# Patient Record
Sex: Female | Born: 1970 | Race: Black or African American | Hispanic: No | Marital: Married | Smoking: Never smoker
Health system: Southern US, Community
[De-identification: ages and names within clinical notes are randomized; demographics above are authoritative.]

## PROBLEM LIST (undated history)

## (undated) DIAGNOSIS — R7303 Prediabetes: Secondary | ICD-10-CM

## (undated) DIAGNOSIS — F909 Attention-deficit hyperactivity disorder, unspecified type: Secondary | ICD-10-CM

## (undated) DIAGNOSIS — L80 Vitiligo: Secondary | ICD-10-CM

## (undated) DIAGNOSIS — A4902 Methicillin resistant Staphylococcus aureus infection, unspecified site: Secondary | ICD-10-CM

## (undated) DIAGNOSIS — O24419 Gestational diabetes mellitus in pregnancy, unspecified control: Secondary | ICD-10-CM

## (undated) DIAGNOSIS — O1490 Unspecified pre-eclampsia, unspecified trimester: Secondary | ICD-10-CM

## (undated) DIAGNOSIS — K4090 Unilateral inguinal hernia, without obstruction or gangrene, not specified as recurrent: Secondary | ICD-10-CM

## (undated) DIAGNOSIS — J40 Bronchitis, not specified as acute or chronic: Secondary | ICD-10-CM

## (undated) DIAGNOSIS — I499 Cardiac arrhythmia, unspecified: Secondary | ICD-10-CM

## (undated) DIAGNOSIS — E7800 Pure hypercholesterolemia, unspecified: Secondary | ICD-10-CM

## (undated) DIAGNOSIS — K5900 Constipation, unspecified: Secondary | ICD-10-CM

## (undated) HISTORY — PX: MOUTH SURGERY: SHX715

## (undated) HISTORY — DX: Pure hypercholesterolemia, unspecified: E78.00

## (undated) HISTORY — PX: WISDOM TOOTH EXTRACTION: SHX21

---

## 1998-12-26 ENCOUNTER — Other Ambulatory Visit: Admission: RE | Admit: 1998-12-26 | Discharge: 1998-12-26 | Payer: Self-pay | Admitting: Obstetrics and Gynecology

## 1999-10-29 ENCOUNTER — Encounter: Admission: RE | Admit: 1999-10-29 | Discharge: 2000-01-27 | Payer: Self-pay | Admitting: *Deleted

## 1999-12-23 ENCOUNTER — Inpatient Hospital Stay (HOSPITAL_COMMUNITY): Admission: AD | Admit: 1999-12-23 | Discharge: 1999-12-26 | Payer: Self-pay | Admitting: Obstetrics and Gynecology

## 1999-12-23 ENCOUNTER — Encounter (INDEPENDENT_AMBULATORY_CARE_PROVIDER_SITE_OTHER): Payer: Self-pay

## 2000-02-18 ENCOUNTER — Other Ambulatory Visit: Admission: RE | Admit: 2000-02-18 | Discharge: 2000-02-18 | Payer: Self-pay | Admitting: Obstetrics and Gynecology

## 2001-04-16 ENCOUNTER — Other Ambulatory Visit: Admission: RE | Admit: 2001-04-16 | Discharge: 2001-04-16 | Payer: Self-pay | Admitting: Obstetrics and Gynecology

## 2002-04-28 ENCOUNTER — Other Ambulatory Visit: Admission: RE | Admit: 2002-04-28 | Discharge: 2002-04-28 | Payer: Self-pay | Admitting: Obstetrics and Gynecology

## 2002-12-02 ENCOUNTER — Encounter: Admission: RE | Admit: 2002-12-02 | Discharge: 2002-12-02 | Payer: Self-pay | Admitting: Obstetrics and Gynecology

## 2002-12-02 ENCOUNTER — Encounter: Payer: Self-pay | Admitting: Obstetrics and Gynecology

## 2004-03-18 ENCOUNTER — Other Ambulatory Visit: Admission: RE | Admit: 2004-03-18 | Discharge: 2004-03-18 | Payer: Self-pay | Admitting: Obstetrics and Gynecology

## 2005-11-18 ENCOUNTER — Emergency Department (HOSPITAL_COMMUNITY): Admission: EM | Admit: 2005-11-18 | Discharge: 2005-11-18 | Payer: Self-pay | Admitting: Family Medicine

## 2007-11-14 ENCOUNTER — Emergency Department (HOSPITAL_COMMUNITY): Admission: EM | Admit: 2007-11-14 | Discharge: 2007-11-14 | Payer: Self-pay | Admitting: Emergency Medicine

## 2008-07-23 ENCOUNTER — Emergency Department (HOSPITAL_COMMUNITY): Admission: EM | Admit: 2008-07-23 | Discharge: 2008-07-23 | Payer: Self-pay | Admitting: Emergency Medicine

## 2009-04-01 ENCOUNTER — Emergency Department (HOSPITAL_COMMUNITY): Admission: EM | Admit: 2009-04-01 | Discharge: 2009-04-01 | Payer: Self-pay | Admitting: Emergency Medicine

## 2011-01-20 LAB — CULTURE, ROUTINE-ABSCESS: Gram Stain: NONE SEEN

## 2011-02-28 NOTE — Op Note (Signed)
[facility]  Patient:    Natasha Liu, Natasha Liu                           MRN: [id number] Proc. Date: 12/23/99 Adm. Date:  [id number] Attending:  Maxie Better                           Operative Report  PREOPERATIVE DIAGNOSES:       Non-reassuring fetal testing, previous cesarean section, class A-1 gestational diabetes, desires permanent sterilization.  POSTOPERATIVE DIAGNOSES:      Non-reassuring fetal testing, previous cesarean section, desires permanent sterilization, class A-1 gestational diabetes.  PROCEDURE:                    Repeat cesarean section, Sharl Ma hysterotomy; modified Pomeroy tubal ligation.  ANESTHESIA:                   Spinal.  SURGEON:                      Sheronette A. Cherly Hensen, M.D.  ASSISTANT:                    Lenoard Aden, M.D.  INDICATION:                   This is a 40 year old gravida 4, para 0-1-2-1 female at 38+ weeks gestation, who had a prior cesarean section and class A-1 gestational diabetes, who was found to have a biophysical profile of 4/10 at the office on December 23, 1999, who is now sent for a repeat cesarean section.  The patient had  been scheduled for a repeat C-section on December 26, 1999; she also desired permanent sterilization.  Risks and benefits of the procedure had been explained; the permanence as well as the nonversibility had also been discussed with respect to the tubal ligation.  The patient still desires to proceed.  The patient had had a nonstress test done in the office secondary to her class A-1 diabetes, which was noted to be nonreactive, and a biophysical profile was therefore ordered which showed a 4/10, -2 for movements and tone.  Consent was signed.  The patient was  transferred to the operating room.  DESCRIPTION OF PROCEDURE:     Under adequate spinal anesthesia, the patient was  placed in the supine position with a left lateral tilt.  The patient was sterilely prepped and  draped in usual fashion.  An indwelling Foley catheter was placed. A Pfannenstiel skin incision was made through a portion of the previous scar and carried down to the rectus fascia using Bovie cautery.  The rectus fascia was incised in the midline and extended bilaterally using Mayo scissors.  The rectus fascia was then bluntly, and with sharp dissection, dissected off the rectus muscle in superior and inferior fashion.  At that point, the parietal peritoneum was found to have been entered superiorly; clear fluid was noted from the abdominal cavity. Using the opening in the parietal peritoneum, the remaining portion of the parietal peritoneum was then opened inferiorly.  A manual inspection of the uterus showed no adhesions.  The vesicouterine peritoneum was then opened.  The bladder was then  bluntly dissected off the lower uterine segment.  The lower uterine segment was  noted to be very thin.  A curvilinear low transverse uterine incision was then ade and  extended bilaterally using bandage scissors.  A copious amount of clear amniotic fluid was noted.  A fetus in the left occipitotransverse position was noted.  Initial attempt at delivery of the vertex was not successful.  A vacuum was then used to apply, with resultant delivery of a live female infant who was bulb-suctioned on the abdomen.  Cord was then clamped and cut.  The baby was transferred to the team.  Apgars of 6/9 were subsequently assigned at one and five minutes.  Cord pH was obtained which was subsequently noted to be 7.12. Placenta was spontaneous, intact.  Uterus was exteriorized.  Uterine cavity was cleaned f debris.  Uterus was noted to have a 3-cm right intramural fibroid and several less than 1-cm subserosal fibroids posteriorly.  Tubes and ovaries were noted to be normal.  Appendix was noted to be normal.  The uterine incision was closed with one layer of 0 Monocryl in a running-locked stitch.  Attention  was then turned to the fallopian tubes.  The right fallopian tube was identified down to its fimbriated end.  The midportion of the tube was grasped with a Babcock.  The mesosalpinx underneath the [place] was then opened with cautery.  The portion of the tube was doubly tied with 0 chromic proximal and distal to the Babcock and the intervening segment of fallopian tube was then removed.  The tip of the cut tube was then cauterized.  The same procedure was performed on the contralateral side after again identifying the fimbriated end of the left fallopian tube.  Specimens labeled segments of right and left fallopian tubes were sent to pathology.  The uterus as then returned to the abdomen.  The abdomen was irrigated and suctioned of debris. Reinspection of the uterine incision showed good hemostasis.  Small bleeding along the peritoneal edge of the bladder was cauterized.  The parietal peritoneum and the vesicouterine peritoneum were now closed.  Inspection of the undersurface of the rectus fascia showed no bleeders.  The rectus fascia was closed with 0 Vicryl x 2. The subcutaneous area was irrigated; small bleeders cauterized.  The subcuticular area was injected with 0.25% Marcaine.  The skin was approximated using Ethicon  staples.  Specimens were placenta, not sent to pathology, and segments of the right and left fallopian tubes.  Estimated blood loss was 800 cc.  Intraoperative fluid was 2300 cc of crystalloid.  Urine output was 250 cc clear-yellow urine. Sponge and instrument counts x 2 were correct.  Complication was none.  The patient tolerated the procedure well and was transferred to the recovery room in stable  condition. DD:  12/23/99 TD:  12/24/99 Job: [id number]

## 2011-02-28 NOTE — Discharge Summary (Signed)
[facility]  Patient:    Natasha Liu, Natasha Liu                           MRN: [id number] Adm. Date:  [id number] Disc. Date: [id number] Attending:  Maxie Better                           Discharge Summary  ADMISSION DIAGNOSES:          1. Non-reassuring fetal testing.                               2. Previous cesarean section.                               3. Class A-1 gestational diabetes.                               4. Desires permanent sterilization.  DISCHARGE DIAGNOSES:          1. Term gestation, delivered.                               2. Class A-1 gestational diabetes.                               3. Desires permanent sterilization.                               4. Non-reassuring fetal testing.  HISTORY OF PRESENT ILLNESS:   This is a 40 year old gravida 4, para 0-1-2-1 female with class A-1 gestational diabetes and previous cesarean section, admitted on December 23, 1999 at 38+ weeks gestation for repeat cesarean section and tubal ligation secondary to biophysical profile of 4/10.  The patient had a nonstress  test which was nonreactive and ultrasound revealed a biophysical profile of 4/10. Prenatal course had been complicated by gestational diabetes, diet-controlled, hemoglobin C trait, with the father of the baby being negative, and observation for preterm cervical change without need for tocolytics.  HOSPITAL COURSE:              The patient was admitted.  She had been monitored  briefly in the Maternity Admissions Unit.  The patient underwent a repeat cesarean section and a modified Pomeroy technique on December 23, 1999, the result of which was a live female infant with Apgars of 7/9.  Cord pH was 7.12.  There was a small right anterior, about 3-cm intramural fibroid noted and multiple small subserosal fibroids less than 1 cm.  Both tubes and ovaries were normal and a normal appendix. The patient postoperatively had a transient elevation  of her blood pressure with normal platelet count and hematocrit and therefore, no other measures have been  taken.  She was monitored postoperatively.  The patient had an uncomplicated postoperative course and her CBC on postop day #1 showed a hemoglobin of 10.0, hematocrit of 29.3, WBC of 14.8 and platelet count of 201,000.  She was tolerating a regular diet, had passed gas and on postop day #3, her wound had shown a slight evidence of being puffy,  without any erythema or induration.  Two staples were removed and incision was probed.  About 7 cc of serosanguineous fluid were removed, thought to be a small wound seroma, without any other evidence of infection. The patient was discharged home, having been afebrile throughout her hospital course.  FOLLOWUP:                     Staple removal in the office on Tuesday. Postpartum followup in four to six weeks.  DISCHARGE MEDICATIONS:        1. Tylox, #30, one to two tablets every three to our                                  hours p.r.n.                               2. Chromagen Forte one p.o. q.d.                               3. Multivitamin one p.o. q.d.                               4. Motrin 800 mg every six to eight hours p.r.n.                                  pain.  DISPOSITION:                  Home.  CONDITION:                    Stable.  PROCEDURES:                   A repeat cesarean section and modified Pomeroy technique.  DISCHARGE INSTRUCTIONS:       Call for temperature greater-than-or-equal-to 101.4, wound drainage, redness of the incision or increased incisional pain; nothing per vagina for four to six weeks; no driving or heavy lifting for two weeks; call for soaking of regular pad every hour or more frequently, severe abdominal pain, nausea or vomiting. DD:  01/13/00 TD:  01/13/00 Job: [id number]

## 2011-07-15 LAB — CBC
HCT: 38.5
Hemoglobin: 12.9
MCHC: 33.5
MCV: 81.3
Platelets: 360
RBC: 4.73
RDW: 14.1
WBC: 8.9

## 2011-07-15 LAB — DIFFERENTIAL
Basophils Absolute: 0
Basophils Relative: 0
Eosinophils Absolute: 0.3
Eosinophils Relative: 3
Lymphocytes Relative: 19
Lymphs Abs: 1.7
Monocytes Absolute: 0.4
Monocytes Relative: 5
Neutro Abs: 6.5
Neutrophils Relative %: 73

## 2013-05-23 ENCOUNTER — Other Ambulatory Visit: Payer: Self-pay | Admitting: Obstetrics and Gynecology

## 2013-05-23 DIAGNOSIS — N920 Excessive and frequent menstruation with regular cycle: Secondary | ICD-10-CM

## 2013-05-23 DIAGNOSIS — D259 Leiomyoma of uterus, unspecified: Secondary | ICD-10-CM

## 2013-05-31 ENCOUNTER — Inpatient Hospital Stay
Admission: RE | Admit: 2013-05-31 | Discharge: 2013-05-31 | Disposition: A | Discharge status: Home / Self Care | Payer: Self-pay | Source: Ambulatory Visit | Attending: Obstetrics and Gynecology | Admitting: Obstetrics and Gynecology

## 2015-10-01 ENCOUNTER — Encounter (HOSPITAL_COMMUNITY): Payer: Self-pay | Admitting: Emergency Medicine

## 2015-10-01 ENCOUNTER — Emergency Department (INDEPENDENT_AMBULATORY_CARE_PROVIDER_SITE_OTHER): Payer: Worker's Compensation

## 2015-10-01 ENCOUNTER — Emergency Department (HOSPITAL_COMMUNITY)
Admission: EM | Admit: 2015-10-01 | Discharge: 2015-10-01 | Discharge status: Absent without leave | Payer: Self-pay | Source: Home / Self Care

## 2015-10-01 ENCOUNTER — Emergency Department (INDEPENDENT_AMBULATORY_CARE_PROVIDER_SITE_OTHER)
Admission: EM | Admit: 2015-10-01 | Discharge: 2015-10-01 | Disposition: A | Discharge status: Home / Self Care | Payer: Worker's Compensation | Source: Home / Self Care

## 2015-10-01 DIAGNOSIS — S8992XA Unspecified injury of left lower leg, initial encounter: Secondary | ICD-10-CM

## 2015-10-01 HISTORY — DX: Methicillin resistant Staphylococcus aureus infection, unspecified site: A49.02
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
## 2015-10-01 NOTE — Discharge Instructions (Signed)
Knee Pain  Knee pain is a common problem. It can have many causes. The pain often goes away by following your doctor's home care instructions. Treatment for ongoing pain will depend on the cause of your pain. If your knee pain continues, more tests may be needed to diagnose your condition. Tests may include X-rays or other imaging studies of your knee.  HOME CARE   Take medicines only as told by your doctor.   Rest your knee and keep it raised (elevated) while you are resting.   Do not do things that cause pain or make your pain worse.   Avoid activities where both feet leave the ground at the same time, such as running, jumping rope, or doing jumping jacks.   Apply ice to the knee area:    Put ice in a plastic bag.    Place a towel between your skin and the bag.    Leave the ice on for 20 minutes, 2-3 times a day.   Ask your doctor if you should wear an elastic knee support.   Sleep with a pillow under your knee.   Lose weight if you are overweight. Being overweight can make your knee hurt more.   Do not use any tobacco products, including cigarettes, chewing tobacco, or electronic cigarettes. If you need help quitting, ask your doctor. Smoking may slow the healing of any bone and joint problems that you may have.  GET HELP IF:   Your knee pain does not stop, it changes, or it gets worse.   You have a fever along with knee pain.   Your knee gives out or locks up.   Your knee becomes more swollen.  GET HELP RIGHT AWAY IF:    Your knee feels hot to the touch.   You have chest pain or trouble breathing.     This information is not intended to replace advice given to you by your health care provider. Make sure you discuss any questions you have with your health care provider.     Document Released: 12/26/2008 Document Revised: 10/20/2014 Document Reviewed: 11/30/2013  Elsevier Interactive Patient Education 2016 Elsevier Inc.
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
## 2016-04-09 ENCOUNTER — Encounter (HOSPITAL_COMMUNITY): Payer: Self-pay | Admitting: Emergency Medicine

## 2016-04-09 ENCOUNTER — Ambulatory Visit (HOSPITAL_COMMUNITY)
Admission: EM | Admit: 2016-04-09 | Discharge: 2016-04-09 | Disposition: A | Discharge status: Home / Self Care | Payer: BC Managed Care – PPO | Attending: Family Medicine | Admitting: Family Medicine

## 2016-04-09 DIAGNOSIS — J209 Acute bronchitis, unspecified: Secondary | ICD-10-CM

## 2016-04-09 MED ORDER — ALBUTEROL SULFATE HFA 108 (90 BASE) MCG/ACT IN AERS: 2.0000 | INHALATION_SPRAY | RESPIRATORY_TRACT | Status: AC | PRN

## 2016-04-09 MED ORDER — KETOROLAC TROMETHAMINE 60 MG/2ML IM SOLN
60.0000 mg | Freq: Once | INTRAMUSCULAR | Status: AC
  Administered 2016-04-09: 60 mg via INTRAMUSCULAR

## 2016-04-09 MED ORDER — ALBUTEROL SULFATE (2.5 MG/3ML) 0.083% IN NEBU
INHALATION_SOLUTION | RESPIRATORY_TRACT | Status: AC
  Filled 2016-04-09: qty 3

## 2016-04-09 MED ORDER — ALBUTEROL SULFATE (2.5 MG/3ML) 0.083% IN NEBU
2.5000 mg | INHALATION_SOLUTION | Freq: Once | RESPIRATORY_TRACT | Status: AC
  Administered 2016-04-09: 2.5 mg via RESPIRATORY_TRACT

## 2016-04-09 MED ORDER — KETOROLAC TROMETHAMINE 60 MG/2ML IM SOLN
INTRAMUSCULAR | Status: AC
  Filled 2016-04-09: qty 2

## 2016-04-09 NOTE — ED Provider Notes (Signed)
CSN: [id number]     Arrival date & time 04/09/16  1607 History   First MD Initiated Contact with Patient 04/09/16 1652     Chief Complaint  Patient presents with  . Cough  . Shortness of Breath   (Consider location/radiation/quality/duration/timing/severity/associated sxs/prior Treatment) HPI  Past Medical History  Diagnosis Date  . MRSA infection    Past Surgical History  Procedure Laterality Date  . Cesarean section     History reviewed. No pertinent family history. Social History  Substance Use Topics  . Smoking status: Never Smoker   . Smokeless tobacco: None  . Alcohol Use: Yes     Comment: ocassionally   OB History    No data available     Review of Systems  Allergies  Aspirin  Home Medications   Prior to Admission medications   Medication Sig Start Date End Date Taking? Authorizing Provider  amoxicillin-clavulanate (AUGMENTIN) 875-125 MG tablet Take 1 tablet by mouth 2 (two) times daily.   Yes Historical Provider, MD  fexofenadine (ALLEGRA) 180 MG tablet Take 180 mg by mouth daily.   Yes Historical Provider, MD  HYDROcodone-homatropine (HYCODAN) 5-1.5 MG/5ML syrup Take 5 mLs by mouth every 6 (six) hours as needed for cough.   Yes Historical Provider, MD  MULTIPLE VITAMIN PO Take by mouth.   Yes Historical Provider, MD  albuterol (PROVENTIL HFA;VENTOLIN HFA) 108 (90 Base) MCG/ACT inhaler Inhale 2 puffs into the lungs every 4 (four) hours as needed for wheezing or shortness of breath. 04/09/16   Lysbeth Penner, FNP  ascorbic acid (VITAMIN C) 500 MG tablet Take 1,000 mg by mouth daily.    Historical Provider, MD  Biotin 5000 MCG CAPS Take by mouth.    Historical Provider, MD   Meds Ordered and Administered this Visit   Medications  albuterol (PROVENTIL) (2.5 MG/3ML) 0.083% nebulizer solution 2.5 mg (2.5 mg Nebulization Given 04/09/16 1714)  ketorolac (TORADOL) injection 60 mg (60 mg Intramuscular Given 04/09/16 1714)    BP 145/93 mmHg  Pulse 91  Temp(Src)  98.5 F (36.9 C) (Oral)  Resp 18  SpO2 98%  LMP 04/08/2016 (Exact Date) No data found.   Physical Exam  ED Course  Procedures (including critical care time)  Labs Review Labs Reviewed - No data to display  Imaging Review No results found.   Visual Acuity Review  Right Eye Distance:   Left Eye Distance:   Bilateral Distance:    Right Eye Near:   Left Eye Near:    Bilateral Near:         MDM   1. Bronchospasm with bronchitis, acute        Billy Fischer, MD 04/09/16 2049

## 2016-04-09 NOTE — Discharge Instructions (Signed)
Take prednisone taper and cough medicine your primary care provided.   Acute Bronchitis Bronchitis is inflammation of the airways that extend from the windpipe into the lungs (bronchi). The inflammation often causes mucus to develop. This leads to a cough, which is the most common symptom of bronchitis.  In acute bronchitis, the condition usually develops suddenly and goes away over time, usually in a couple weeks. Smoking, allergies, and asthma can make bronchitis worse. Repeated episodes of bronchitis may cause further lung problems.  CAUSES Acute bronchitis is most often caused by the same virus that causes a cold. The virus can spread from person to person (contagious) through coughing, sneezing, and touching contaminated objects. SIGNS AND SYMPTOMS   Cough.   Fever.   Coughing up mucus.   Body aches.   Chest congestion.   Chills.   Shortness of breath.   Sore throat.  DIAGNOSIS  Acute bronchitis is usually diagnosed through a physical exam. Your health care provider will also ask you questions about your medical history. Tests, such as chest X-rays, are sometimes done to rule out other conditions.  TREATMENT  Acute bronchitis usually goes away in a couple weeks. Oftentimes, no medical treatment is necessary. Medicines are sometimes given for relief of fever or cough. Antibiotic medicines are usually not needed but may be prescribed in certain situations. In some cases, an inhaler may be recommended to help reduce shortness of breath and control the cough. A cool mist vaporizer may also be used to help thin bronchial secretions and make it easier to clear the chest.  HOME CARE INSTRUCTIONS  Get plenty of rest.   Drink enough fluids to keep your urine clear or pale yellow (unless you have a medical condition that requires fluid restriction). Increasing fluids may help thin your respiratory secretions (sputum) and reduce chest congestion, and it will prevent dehydration.    Take medicines only as directed by your health care provider.  If you were prescribed an antibiotic medicine, finish it all even if you start to feel better.  Avoid smoking and secondhand smoke. Exposure to cigarette smoke or irritating chemicals will make bronchitis worse. If you are a smoker, consider using nicotine gum or skin patches to help control withdrawal symptoms. Quitting smoking will help your lungs heal faster.   Reduce the chances of another bout of acute bronchitis by washing your hands frequently, avoiding people with cold symptoms, and trying not to touch your hands to your mouth, nose, or eyes.   Keep all follow-up visits as directed by your health care provider.  SEEK MEDICAL CARE IF: Your symptoms do not improve after 1 week of treatment.  SEEK IMMEDIATE MEDICAL CARE IF:  You develop an increased fever or chills.   You have chest pain.   You have severe shortness of breath.  You have bloody sputum.   You develop dehydration.  You faint or repeatedly feel like you are going to pass out.  You develop repeated vomiting.  You develop a severe headache. MAKE SURE YOU:   Understand these instructions.  Will watch your condition.  Will get help right away if you are not doing well or get worse.   This information is not intended to replace advice given to you by your health care provider. Make sure you discuss any questions you have with your health care provider.   Document Released: 11/06/2004 Document Revised: 10/20/2014 Document Reviewed: 03/22/2013 Elsevier Interactive Patient Education [employer].

## 2016-04-09 NOTE — ED Notes (Signed)
The patient presented to the [facility] with a complaint of a cough and shortness of breath that started 8 days ago. The patient stated that she went to a different [facility] 2 days into the cough and was prescribed an oral antibiotic and a codeine cough syrup. The patient stated that she is not able to sleep and is having neck and chest muscle pains related to the continuous coughing.

## 2016-08-11 ENCOUNTER — Other Ambulatory Visit: Payer: Self-pay | Admitting: Obstetrics and Gynecology

## 2016-08-26 ENCOUNTER — Encounter (HOSPITAL_COMMUNITY)
Admission: RE | Admit: 2016-08-26 | Discharge: 2016-08-26 | Disposition: A | Discharge status: Home / Self Care | Payer: BC Managed Care – PPO | Source: Ambulatory Visit | Attending: Obstetrics and Gynecology | Admitting: Obstetrics and Gynecology

## 2016-08-26 ENCOUNTER — Encounter (HOSPITAL_COMMUNITY): Payer: Self-pay

## 2016-08-26 DIAGNOSIS — Z01818 Encounter for other preprocedural examination: Secondary | ICD-10-CM | POA: Diagnosis not present

## 2016-08-26 HISTORY — DX: Vitiligo: L80

## 2016-08-26 HISTORY — DX: Unspecified pre-eclampsia, unspecified trimester: O14.90

## 2016-08-26 HISTORY — DX: Gestational diabetes mellitus in pregnancy, unspecified control: O24.419

## 2016-08-26 HISTORY — DX: Cardiac arrhythmia, unspecified: I49.9

## 2016-08-26 HISTORY — DX: Bronchitis, not specified as acute or chronic: J40

## 2016-08-26 HISTORY — DX: Constipation, unspecified: K59.00

## 2016-08-26 HISTORY — DX: Attention-deficit hyperactivity disorder, unspecified type: F90.9

## 2016-08-26 HISTORY — DX: Prediabetes: R73.03

## 2016-08-26 HISTORY — DX: Unilateral inguinal hernia, without obstruction or gangrene, not specified as recurrent: K40.90

## 2016-08-26 LAB — SURGICAL PCR SCREEN
MRSA, PCR: NEGATIVE
STAPHYLOCOCCUS AUREUS: NEGATIVE

## 2016-08-26 LAB — CBC
HCT: 35.8 % — ABNORMAL LOW (ref 36.0–46.0)
Hemoglobin: 12.6 g/dL (ref 12.0–15.0)
MCH: 26.9 pg (ref 26.0–34.0)
MCHC: 35.2 g/dL (ref 30.0–36.0)
MCV: 76.5 fL — ABNORMAL LOW (ref 78.0–100.0)
Platelets: 376 10*3/uL (ref 150–400)
RBC: 4.68 MIL/uL (ref 3.87–5.11)
RDW: 13.9 % (ref 11.5–15.5)
WBC: 9 10*3/uL (ref 4.0–10.5)

## 2016-08-26 NOTE — Patient Instructions (Addendum)
Your procedure is scheduled on:  Monday, Nov. 27,2017  Enter through the Main Entrance of [facility] at:  11:30 AM  Pick up the phone at the desk and dial [phone].  Call this number if you have problems the morning of surgery: [phone].  Remember: Do NOT eat food:  After Midnight Sunday, Nov. 26, 2017  Do NOT drink clear liquids after:  7:00 AM day of surgery  Take these medicines the morning of surgery with a SIP OF WATER:  None  Bring Asthma Inhaler day of surgery  Stop ALL herbal medications and Vitamin C at this time   Do NOT wear jewelry (body piercing), metal hair clips/bobby pins, make-up, or nail polish. Do NOT wear lotions, powders, or perfumes.  You may wear deodorant. Do NOT shave for 48 hours prior to surgery. Do NOT bring valuables to the hospital. Contacts, dentures, or bridgework may not be worn into surgery.  Leave suitcase in car.  After surgery it may be brought to your room.  For patients admitted to the hospital, checkout time is 11:00 AM the day of discharge.

## 2016-09-08 ENCOUNTER — Encounter (HOSPITAL_COMMUNITY)
Admission: RE | Disposition: A | Discharge status: Home / Self Care | Payer: Self-pay | Source: Ambulatory Visit | Attending: Obstetrics and Gynecology

## 2016-09-08 ENCOUNTER — Ambulatory Visit (HOSPITAL_COMMUNITY): Payer: BC Managed Care – PPO | Admitting: Anesthesiology

## 2016-09-08 ENCOUNTER — Ambulatory Visit (HOSPITAL_COMMUNITY)
Admission: RE | Admit: 2016-09-08 | Discharge: 2016-09-09 | Disposition: A | Discharge status: Home / Self Care | Payer: BC Managed Care – PPO | Source: Ambulatory Visit | Attending: Obstetrics and Gynecology | Admitting: Obstetrics and Gynecology

## 2016-09-08 ENCOUNTER — Encounter (HOSPITAL_COMMUNITY): Payer: Self-pay

## 2016-09-08 DIAGNOSIS — N920 Excessive and frequent menstruation with regular cycle: Secondary | ICD-10-CM | POA: Insufficient documentation

## 2016-09-08 DIAGNOSIS — D251 Intramural leiomyoma of uterus: Secondary | ICD-10-CM | POA: Insufficient documentation

## 2016-09-08 DIAGNOSIS — Z888 Allergy status to other drugs, medicaments and biological substances status: Secondary | ICD-10-CM | POA: Diagnosis not present

## 2016-09-08 DIAGNOSIS — N888 Other specified noninflammatory disorders of cervix uteri: Secondary | ICD-10-CM | POA: Insufficient documentation

## 2016-09-08 DIAGNOSIS — J45909 Unspecified asthma, uncomplicated: Secondary | ICD-10-CM | POA: Insufficient documentation

## 2016-09-08 DIAGNOSIS — D582 Other hemoglobinopathies: Secondary | ICD-10-CM | POA: Diagnosis not present

## 2016-09-08 DIAGNOSIS — N800 Endometriosis of uterus: Secondary | ICD-10-CM | POA: Insufficient documentation

## 2016-09-08 DIAGNOSIS — E669 Obesity, unspecified: Secondary | ICD-10-CM | POA: Diagnosis not present

## 2016-09-08 DIAGNOSIS — Z6835 Body mass index (BMI) 35.0-35.9, adult: Secondary | ICD-10-CM | POA: Diagnosis not present

## 2016-09-08 DIAGNOSIS — Z90710 Acquired absence of both cervix and uterus: Secondary | ICD-10-CM

## 2016-09-08 DIAGNOSIS — Z886 Allergy status to analgesic agent status: Secondary | ICD-10-CM | POA: Insufficient documentation

## 2016-09-08 HISTORY — PX: ROBOTIC ASSISTED TOTAL HYSTERECTOMY WITH SALPINGECTOMY: SHX6679

## 2016-09-08 SURGERY — ROBOTIC ASSISTED TOTAL HYSTERECTOMY WITH SALPINGECTOMY
Anesthesia: General | Site: Abdomen | Laterality: Bilateral

## 2016-09-08 MED ORDER — LACTATED RINGERS IR SOLN
Status: DC | PRN
  Administered 2016-09-08: 3000 mL

## 2016-09-08 MED ORDER — PROPOFOL 10 MG/ML IV BOLUS
INTRAVENOUS | Status: DC | PRN
  Administered 2016-09-08: 180 mg via INTRAVENOUS

## 2016-09-08 MED ORDER — ROCURONIUM BROMIDE 100 MG/10ML IV SOLN
INTRAVENOUS | Status: AC
Start: 2016-09-08 — End: 2016-09-08
  Filled 2016-09-08: qty 1

## 2016-09-08 MED ORDER — HYDROMORPHONE HCL 1 MG/ML IJ SOLN
0.2500 mg | INTRAMUSCULAR | Status: DC | PRN
  Administered 2016-09-08: 0.5 mg via INTRAVENOUS

## 2016-09-08 MED ORDER — MENTHOL 3 MG MT LOZG
1.0000 | LOZENGE | OROMUCOSAL | Status: DC | PRN
  Administered 2016-09-09: 3 mg via ORAL
  Filled 2016-09-08 (×2): qty 9

## 2016-09-08 MED ORDER — CEFAZOLIN SODIUM-DEXTROSE 2-4 GM/100ML-% IV SOLN
2.0000 g | INTRAVENOUS | Status: AC
  Administered 2016-09-08: 2 g via INTRAVENOUS

## 2016-09-08 MED ORDER — BUPIVACAINE HCL (PF) 0.25 % IJ SOLN
INTRAMUSCULAR | Status: DC | PRN
  Administered 2016-09-08: 12 mL

## 2016-09-08 MED ORDER — FENTANYL CITRATE (PF) 250 MCG/5ML IJ SOLN
INTRAMUSCULAR | Status: AC
  Filled 2016-09-08: qty 5

## 2016-09-08 MED ORDER — ONDANSETRON HCL 4 MG PO TABS: 4.0000 mg | ORAL_TABLET | Freq: Four times a day (QID) | ORAL | Status: DC | PRN

## 2016-09-08 MED ORDER — EPHEDRINE SULFATE 50 MG/ML IJ SOLN
INTRAMUSCULAR | Status: DC | PRN
  Administered 2016-09-08: 5 mg via INTRAVENOUS

## 2016-09-08 MED ORDER — IBUPROFEN 800 MG PO TABS: 800.0000 mg | ORAL_TABLET | Freq: Three times a day (TID) | ORAL | Status: DC | PRN

## 2016-09-08 MED ORDER — HYDROMORPHONE HCL 1 MG/ML IJ SOLN: 0.2500 mg | INTRAMUSCULAR | Status: DC | PRN

## 2016-09-08 MED ORDER — MIDAZOLAM HCL 2 MG/2ML IJ SOLN: 0.5000 mg | Freq: Once | INTRAMUSCULAR | Status: DC | PRN

## 2016-09-08 MED ORDER — SUGAMMADEX SODIUM 200 MG/2ML IV SOLN
INTRAVENOUS | Status: DC | PRN
  Administered 2016-09-08: 162.8 mg via INTRAVENOUS

## 2016-09-08 MED ORDER — ONDANSETRON HCL 4 MG/2ML IJ SOLN
INTRAMUSCULAR | Status: AC
  Filled 2016-09-08: qty 2

## 2016-09-08 MED ORDER — CEFAZOLIN SODIUM-DEXTROSE 2-4 GM/100ML-% IV SOLN: 2.0000 g | Freq: Once | INTRAVENOUS | Status: DC

## 2016-09-08 MED ORDER — DEXTROSE IN LACTATED RINGERS 5 % IV SOLN
INTRAVENOUS | Status: DC
  Administered 2016-09-08: via INTRAVENOUS

## 2016-09-08 MED ORDER — PROPOFOL 10 MG/ML IV BOLUS
INTRAVENOUS | Status: AC
Start: 2016-09-08 — End: 2016-09-08
  Filled 2016-09-08: qty 20

## 2016-09-08 MED ORDER — KETOROLAC TROMETHAMINE 30 MG/ML IJ SOLN
INTRAMUSCULAR | Status: AC
Start: 2016-09-08 — End: 2016-09-08
  Filled 2016-09-08: qty 1

## 2016-09-08 MED ORDER — MIDAZOLAM HCL 2 MG/2ML IJ SOLN
INTRAMUSCULAR | Status: AC
  Filled 2016-09-08: qty 2

## 2016-09-08 MED ORDER — SCOPOLAMINE 1 MG/3DAYS TD PT72
MEDICATED_PATCH | TRANSDERMAL | Status: AC
  Administered 2016-09-08: 1.5 mg via TRANSDERMAL
  Filled 2016-09-08: qty 1

## 2016-09-08 MED ORDER — KETOROLAC TROMETHAMINE 30 MG/ML IJ SOLN
INTRAMUSCULAR | Status: DC | PRN
  Administered 2016-09-08: 30 mg via INTRAVENOUS

## 2016-09-08 MED ORDER — FENTANYL CITRATE (PF) 100 MCG/2ML IJ SOLN
INTRAMUSCULAR | Status: DC | PRN
  Administered 2016-09-08: 150 ug via INTRAVENOUS
  Administered 2016-09-08: 100 ug via INTRAVENOUS

## 2016-09-08 MED ORDER — LACTATED RINGERS IV SOLN
INTRAVENOUS | Status: DC
  Administered 2016-09-08: 125 mL via INTRAVENOUS
  Administered 2016-09-08 (×2): via INTRAVENOUS

## 2016-09-08 MED ORDER — ROPIVACAINE HCL 5 MG/ML IJ SOLN
INTRAMUSCULAR | Status: AC
  Filled 2016-09-08: qty 30

## 2016-09-08 MED ORDER — SUGAMMADEX SODIUM 200 MG/2ML IV SOLN
INTRAVENOUS | Status: AC
  Filled 2016-09-08: qty 2

## 2016-09-08 MED ORDER — BUPIVACAINE HCL (PF) 0.25 % IJ SOLN
INTRAMUSCULAR | Status: AC
  Filled 2016-09-08: qty 30

## 2016-09-08 MED ORDER — HYDROMORPHONE HCL 1 MG/ML IJ SOLN
0.2000 mg | INTRAMUSCULAR | Status: DC | PRN
  Administered 2016-09-08 – 2016-09-09 (×3): 0.6 mg via INTRAVENOUS
  Filled 2016-09-08 (×3): qty 1

## 2016-09-08 MED ORDER — DEXAMETHASONE SODIUM PHOSPHATE 4 MG/ML IJ SOLN
INTRAMUSCULAR | Status: AC
  Filled 2016-09-08: qty 1

## 2016-09-08 MED ORDER — OXYCODONE-ACETAMINOPHEN 5-325 MG PO TABS
1.0000 | ORAL_TABLET | ORAL | Status: DC | PRN
  Administered 2016-09-09 (×2): 1 via ORAL
  Filled 2016-09-08: qty 1
  Filled 2016-09-08: qty 2

## 2016-09-08 MED ORDER — ROCURONIUM BROMIDE 100 MG/10ML IV SOLN
INTRAVENOUS | Status: DC | PRN
  Administered 2016-09-08: 10 mg via INTRAVENOUS
  Administered 2016-09-08: 50 mg via INTRAVENOUS

## 2016-09-08 MED ORDER — LISDEXAMFETAMINE DIMESYLATE 30 MG PO CAPS: 30.0000 mg | ORAL_CAPSULE | Freq: Every day | ORAL | Status: DC | PRN

## 2016-09-08 MED ORDER — ONDANSETRON HCL 4 MG/2ML IJ SOLN
INTRAMUSCULAR | Status: DC | PRN
  Administered 2016-09-08: 4 mg via INTRAVENOUS

## 2016-09-08 MED ORDER — LIDOCAINE HCL (CARDIAC) 20 MG/ML IV SOLN
INTRAVENOUS | Status: AC
  Filled 2016-09-08: qty 5

## 2016-09-08 MED ORDER — PANTOPRAZOLE SODIUM 40 MG PO TBEC
40.0000 mg | DELAYED_RELEASE_TABLET | Freq: Every day | ORAL | Status: DC
  Administered 2016-09-09: 40 mg via ORAL
  Filled 2016-09-08: qty 1

## 2016-09-08 MED ORDER — ARTIFICIAL TEARS OP OINT
TOPICAL_OINTMENT | OPHTHALMIC | Status: AC
  Filled 2016-09-08: qty 3.5

## 2016-09-08 MED ORDER — MEPERIDINE HCL 25 MG/ML IJ SOLN: 6.2500 mg | INTRAMUSCULAR | Status: DC | PRN

## 2016-09-08 MED ORDER — HYDROMORPHONE HCL 1 MG/ML IJ SOLN
INTRAMUSCULAR | Status: AC
  Administered 2016-09-08: 0.5 mg via INTRAVENOUS
  Filled 2016-09-08: qty 1

## 2016-09-08 MED ORDER — ALBUTEROL SULFATE (2.5 MG/3ML) 0.083% IN NEBU: 3.0000 mL | INHALATION_SOLUTION | RESPIRATORY_TRACT | Status: DC | PRN

## 2016-09-08 MED ORDER — SIMETHICONE 80 MG PO CHEW: 80.0000 mg | CHEWABLE_TABLET | Freq: Four times a day (QID) | ORAL | Status: DC | PRN

## 2016-09-08 MED ORDER — MIDAZOLAM HCL 2 MG/2ML IJ SOLN
INTRAMUSCULAR | Status: DC | PRN
  Administered 2016-09-08: 2 mg via INTRAVENOUS

## 2016-09-08 MED ORDER — DEXAMETHASONE SODIUM PHOSPHATE 10 MG/ML IJ SOLN
INTRAMUSCULAR | Status: DC | PRN
  Administered 2016-09-08: 10 mg via INTRAVENOUS

## 2016-09-08 MED ORDER — PROMETHAZINE HCL 25 MG/ML IJ SOLN: 6.2500 mg | INTRAMUSCULAR | Status: DC | PRN

## 2016-09-08 MED ORDER — SODIUM CHLORIDE 0.9 % IJ SOLN
INTRAMUSCULAR | Status: AC
Start: 2016-09-08 — End: 2016-09-08
  Filled 2016-09-08: qty 50

## 2016-09-08 MED ORDER — LIDOCAINE HCL (CARDIAC) 20 MG/ML IV SOLN
INTRAVENOUS | Status: DC | PRN
  Administered 2016-09-08: 100 mg via INTRAVENOUS

## 2016-09-08 MED ORDER — ONDANSETRON HCL 4 MG/2ML IJ SOLN: 4.0000 mg | Freq: Four times a day (QID) | INTRAMUSCULAR | Status: DC | PRN

## 2016-09-08 MED ORDER — SCOPOLAMINE 1 MG/3DAYS TD PT72
1.0000 | MEDICATED_PATCH | Freq: Once | TRANSDERMAL | Status: DC
  Administered 2016-09-08: 1.5 mg via TRANSDERMAL

## 2016-09-08 SURGICAL SUPPLY — 58 items
ADH SKN CLS APL DERMABOND .7 (GAUZE/BANDAGES/DRESSINGS) ×1
APL SRG 38 LTWT LNG FL B (MISCELLANEOUS) ×1
APPLICATOR ARISTA FLEXITIP XL (MISCELLANEOUS) ×2 IMPLANT
BARRIER ADHS 3X4 INTERCEED (GAUZE/BANDAGES/DRESSINGS) IMPLANT
BRR ADH 4X3 ABS CNTRL BYND (GAUZE/BANDAGES/DRESSINGS)
CATH FOLEY 3WAY  5CC 16FR (CATHETERS) ×2
CATH FOLEY 3WAY 5CC 16FR (CATHETERS) ×1 IMPLANT
CLOTH BEACON ORANGE TIMEOUT ST (SAFETY) ×3 IMPLANT
CONT PATH 16OZ SNAP LID 3702 (MISCELLANEOUS) ×3 IMPLANT
COVER BACK TABLE 60X90IN (DRAPES) ×6 IMPLANT
COVER TIP SHEARS 8 DVNC (MISCELLANEOUS) ×1 IMPLANT
COVER TIP SHEARS 8MM DA VINCI (MISCELLANEOUS) ×2
DECANTER SPIKE VIAL GLASS SM (MISCELLANEOUS) ×6 IMPLANT
DEFOGGER SCOPE WARMER CLEARIFY (MISCELLANEOUS) ×3 IMPLANT
DERMABOND ADVANCED (GAUZE/BANDAGES/DRESSINGS) ×2
DERMABOND ADVANCED .7 DNX12 (GAUZE/BANDAGES/DRESSINGS) ×1 IMPLANT
DURAPREP 26ML APPLICATOR (WOUND CARE) ×3 IMPLANT
ELECT REM PT RETURN 9FT ADLT (ELECTROSURGICAL) ×3
ELECTRODE REM PT RTRN 9FT ADLT (ELECTROSURGICAL) ×1 IMPLANT
GAUZE VASELINE 3X9 (GAUZE/BANDAGES/DRESSINGS) IMPLANT
GLOVE BIOGEL PI IND STRL 7.0 (GLOVE) ×5 IMPLANT
GLOVE BIOGEL PI INDICATOR 7.0 (GLOVE) ×10
GLOVE ECLIPSE 6.5 STRL STRAW (GLOVE) ×9 IMPLANT
HEMOSTAT ARISTA ABSORB 3G PWDR (MISCELLANEOUS) ×3 IMPLANT
KIT ACCESSORY DA VINCI DISP (KITS) ×2
KIT ACCESSORY DVNC DISP (KITS) ×1 IMPLANT
LEGGING LITHOTOMY PAIR STRL (DRAPES) ×3 IMPLANT
NEEDLE INSUFFLATION 120MM (ENDOMECHANICALS) ×3 IMPLANT
OCCLUDER COLPOPNEUMO (BALLOONS) ×3 IMPLANT
PACK ROBOT WH (CUSTOM PROCEDURE TRAY) ×3 IMPLANT
PACK ROBOTIC GOWN (GOWN DISPOSABLE) ×3 IMPLANT
PACK TRENDGUARD 450 HYBRID PRO (MISCELLANEOUS) ×1 IMPLANT
PAD PREP 24X48 CUFFED NSTRL (MISCELLANEOUS) ×3 IMPLANT
PROTECTOR NERVE ULNAR (MISCELLANEOUS) ×6 IMPLANT
SET CYSTO W/LG BORE CLAMP LF (SET/KITS/TRAYS/PACK) IMPLANT
SET IRRIG TUBING LAPAROSCOPIC (IRRIGATION / IRRIGATOR) ×3 IMPLANT
SET TRI-LUMEN FLTR TB AIRSEAL (TUBING) ×3 IMPLANT
SUT MNCRL AB 4-0 PS2 18 (SUTURE) ×6 IMPLANT
SUT VIC AB 0 CT1 27 (SUTURE) ×3
SUT VIC AB 0 CT1 27XBRD ANBCTR (SUTURE) ×1 IMPLANT
SUT VIC AB 0 CT1 36 (SUTURE) ×6 IMPLANT
SUT VICRYL 0 UR6 27IN ABS (SUTURE) ×3 IMPLANT
SUT VLOC 180 0 9IN  GS21 (SUTURE) ×4
SUT VLOC 180 0 9IN GS21 (SUTURE) IMPLANT
SYR 50ML LL SCALE MARK (SYRINGE) ×3 IMPLANT
SYRINGE 10CC LL (SYRINGE) ×3 IMPLANT
TIP RUMI ORANGE 6.7MMX12CM (TIP) IMPLANT
TIP UTERINE 5.1X6CM LAV DISP (MISCELLANEOUS) IMPLANT
TIP UTERINE 6.7X10CM GRN DISP (MISCELLANEOUS) IMPLANT
TIP UTERINE 6.7X6CM WHT DISP (MISCELLANEOUS) IMPLANT
TIP UTERINE 6.7X8CM BLUE DISP (MISCELLANEOUS) ×3 IMPLANT
TOWEL OR 17X24 6PK STRL BLUE (TOWEL DISPOSABLE) ×9 IMPLANT
TRENDGUARD 450 HYBRID PRO PACK (MISCELLANEOUS) ×3
TROCAR DISP BLADELESS 8 DVNC (TROCAR) ×1 IMPLANT
TROCAR DISP BLADELESS 8MM (TROCAR) ×2
TROCAR PORT AIRSEAL 8X120 (TROCAR) ×3 IMPLANT
TROCAR Z-THREAD 12X150 (TROCAR) ×3 IMPLANT
WATER STERILE IRR 1000ML POUR (IV SOLUTION) ×3 IMPLANT

## 2016-09-08 NOTE — Transfer of Care (Signed)
Immediate Anesthesia Transfer of Care Note  Patient: Natasha Liu  Procedure(s) Performed: Procedure(s): ROBOTIC ASSISTED TOTAL HYSTERECTOMY WITH SALPINGECTOMY (Bilateral)  Patient Location: PACU  Anesthesia Type:General  Level of Consciousness: awake, alert  and oriented  Airway & Oxygen Therapy: Patient Spontanous Breathing and Patient connected to nasal cannula oxygen  Post-op Assessment: Report given to RN, Post -op Vital signs reviewed and stable and Patient moving all extremities  Post vital signs: Reviewed and stable  Last Vitals:  Vitals:   09/08/16 1146  BP: (!) 136/96  Pulse: 98  Resp: 18  Temp: 36.7 C    Last Pain:  Vitals:   09/08/16 1146  TempSrc: Oral      Patients Stated Pain Goal: 5 ([id number])  Complications: No apparent anesthesia complications

## 2016-09-08 NOTE — OR Nursing (Signed)
Updated pt husband per phone call at Dr. Harvie Bridge request

## 2016-09-08 NOTE — Anesthesia Postprocedure Evaluation (Signed)
Anesthesia Post Note  Patient: Natasha Liu  Procedure(s) Performed: Procedure(s) (LRB): ROBOTIC ASSISTED TOTAL HYSTERECTOMY WITH SALPINGECTOMY (Bilateral)  Patient location during evaluation: [facility] Anesthesia Type: General Level of consciousness: awake and alert and oriented Pain management: pain level controlled Vital Signs Assessment: post-procedure vital signs reviewed and stable Respiratory status: nonlabored ventilation, spontaneous breathing, respiratory function stable and patient connected to nasal cannula oxygen Cardiovascular status: stable Postop Assessment: no signs of nausea or vomiting and adequate PO intake Anesthetic complications: no     Last Vitals:  Vitals:   09/08/16 1930 09/08/16 2030  BP: 128/70 125/64  Pulse: 96 (!) 101  Resp: 18 18  Temp: 36.6 C 37.1 C    Last Pain:  Vitals:   09/08/16 2030  TempSrc: Oral  PainSc:    Pain Goal: Patients Stated Pain Goal: 2 (09/08/16 1930)               Willa Rough

## 2016-09-08 NOTE — Brief Op Note (Signed)
09/08/2016  4:28 PM  PATIENT:  Natasha Liu  45 y.o. female  PRE-OPERATIVE DIAGNOSIS:  Menorrhagia, Fibroid Uterus, Previous Cesarean Section  POST-OPERATIVE DIAGNOSIS:  menorrhagia, fibroid uterus, previous cesarean section  PROCEDURE:  Procedure(s): ROBOTIC ASSISTED TOTAL HYSTERECTOMY WITH SALPINGECTOMY (Bilateral)  SURGEON:  Surgeon(s) and Role:    * Servando Salina, MD - Primary  PHYSICIAN ASSISTANT:   ASSISTANTS: Derrell Lolling, CNM  Findings: surgical separated tubes, vascular bladder area, bilateral ureter peristalsis, left ov with small cyst, nl ovaries, nl liver edge. Large fibroid uterus, some bladder adhesions. Uterine weight>300mg  ANESTHESIA:   general  EBL:  Total I/O In: 1000 [I.V.:1000] Out: 200 [Urine:150; Blood:50]  BLOOD ADMINISTERED:none  DRAINS: none   LOCAL MEDICATIONS USED:  MARCAINE     SPECIMEN:  Source of Specimen:  uterus, cervix, tubes, ovaries  DISPOSITION OF SPECIMEN:  PATHOLOGY  COUNTS:  YES  TOURNIQUET:  * No tourniquets in log *  DICTATION: .Other Dictation: Dictation Number [id number]  PLAN OF CARE: Admit for overnight observation  PATIENT DISPOSITION:  PACU - hemodynamically stable.   Delay start of Pharmacological VTE agent (>24hrs) due to surgical blood loss or risk of bleeding: no

## 2016-09-08 NOTE — Addendum Note (Signed)
Addendum  created 09/08/16 2204 by Jonna Munro, CRNA   Sign clinical note

## 2016-09-08 NOTE — Anesthesia Procedure Notes (Signed)
Procedure Name: Intubation Date/Time: 09/08/2016 1:09 PM Performed by: Hewitt Blade Pre-anesthesia Checklist: Patient identified, Emergency Drugs available, Suction available and Patient being monitored Patient Re-evaluated:Patient Re-evaluated prior to inductionOxygen Delivery Method: Circle system utilized Preoxygenation: Pre-oxygenation with 100% oxygen Intubation Type: IV induction Ventilation: Two handed mask ventilation required and Oral airway inserted - appropriate to patient size Laryngoscope Size: Mac and 3 Grade View: Grade II Tube type: Oral Tube size: 7.0 mm Number of attempts: 1 Airway Equipment and Method: Stylet Placement Confirmation: ETT inserted through vocal cords under direct vision,  positive ETCO2 and breath sounds checked- equal and bilateral Secured at: 22 cm Tube secured with: Tape Dental Injury: Teeth and Oropharynx as per pre-operative assessment

## 2016-09-08 NOTE — Anesthesia Preprocedure Evaluation (Addendum)
Anesthesia Evaluation  Patient identified by MRN, date of birth, ID band Patient awake    Reviewed: Allergy & Precautions, NPO status , Patient's Chart, lab work & pertinent test results  History of Anesthesia Complications Negative for: history of anesthetic complications  Airway Mallampati: I  TM Distance: >3 FB Neck ROM: Full    Dental  (+) Chipped, Dental Advisory Given   Pulmonary asthma (last inhaler needed 3 months ago) ,    breath sounds clear to auscultation       Cardiovascular (-) hypertension Rhythm:Regular Rate:Normal     Neuro/Psych PSYCHIATRIC DISORDERS (ADHD) negative neurological ROS     GI/Hepatic negative GI ROS, Neg liver ROS,   Endo/Other  neg diabetes  Renal/GU negative Renal ROS     Musculoskeletal   Abdominal (+) + obese,   Peds  Hematology negative hematology ROS (+)   Anesthesia Other Findings   Reproductive/Obstetrics                            Anesthesia Physical Anesthesia Plan  ASA: II  Anesthesia Plan: General   Post-op Pain Management:    Induction: Intravenous  Airway Management Planned: Oral ETT  Additional Equipment:   Intra-op Plan:   Post-operative Plan: Extubation in OR  Informed Consent: I have reviewed the patients History and Physical, chart, labs and discussed the procedure including the risks, benefits and alternatives for the proposed anesthesia with the patient or authorized representative who has indicated his/her understanding and acceptance.   Dental advisory given  Plan Discussed with: CRNA and Surgeon  Anesthesia Plan Comments: (Plan routine monitors, GETA)        Anesthesia Quick Evaluation

## 2016-09-08 NOTE — Progress Notes (Addendum)
Patient has arrived to room, vital signs WNL, IV intact, foley draining to gravity.  Pain level is a 2/10.  Surgical sites are unremarkable. Stanton Kidney, RN will be handling care of patient for evening shift.

## 2016-09-08 NOTE — Anesthesia Postprocedure Evaluation (Signed)
Anesthesia Post Note  Patient: NASIR STOLTMAN  Procedure(s) Performed: Procedure(s) (LRB): ROBOTIC ASSISTED TOTAL HYSTERECTOMY WITH SALPINGECTOMY (Bilateral)  Patient location during evaluation: PACU Anesthesia Type: General Level of consciousness: awake and alert Pain management: pain level controlled Vital Signs Assessment: post-procedure vital signs reviewed and stable Respiratory status: spontaneous breathing, nonlabored ventilation, respiratory function stable and patient connected to nasal cannula oxygen Cardiovascular status: blood pressure returned to baseline and stable Postop Assessment: no signs of nausea or vomiting Anesthetic complications: no     Last Vitals:  Vitals:   09/08/16 1730 09/08/16 1745  BP:  123/76  Pulse:  100  Resp: 18 18  Temp:  37 C    Last Pain:  Vitals:   09/08/16 1745  TempSrc:   PainSc: 4    Pain Goal: Patients Stated Pain Goal: 5 (09/08/16 1146)               Montez Hageman

## 2016-09-09 ENCOUNTER — Encounter (HOSPITAL_COMMUNITY): Payer: Self-pay | Admitting: Obstetrics and Gynecology

## 2016-09-09 DIAGNOSIS — N920 Excessive and frequent menstruation with regular cycle: Secondary | ICD-10-CM | POA: Diagnosis not present

## 2016-09-09 LAB — CBC
HEMATOCRIT: 31.3 % — AB (ref 36.0–46.0)
Hemoglobin: 11 g/dL — ABNORMAL LOW (ref 12.0–15.0)
MCH: 26.7 pg (ref 26.0–34.0)
MCHC: 35.1 g/dL (ref 30.0–36.0)
MCV: 76 fL — AB (ref 78.0–100.0)
PLATELETS: 361 10*3/uL (ref 150–400)
RBC: 4.12 MIL/uL (ref 3.87–5.11)
RDW: 13.9 % (ref 11.5–15.5)
WBC: 15.6 10*3/uL — ABNORMAL HIGH (ref 4.0–10.5)

## 2016-09-09 LAB — BASIC METABOLIC PANEL
Anion gap: 7 (ref 5–15)
BUN: 8 mg/dL (ref 6–20)
CHLORIDE: 101 mmol/L (ref 101–111)
CO2: 27 mmol/L (ref 22–32)
Calcium: 8.4 mg/dL — ABNORMAL LOW (ref 8.9–10.3)
Creatinine, Ser: 0.62 mg/dL (ref 0.44–1.00)
GFR calc Af Amer: >60 mL/min (ref >60)
GLUCOSE: 165 mg/dL — AB (ref 65–99)
POTASSIUM: 4.5 mmol/L (ref 3.5–5.1)
Sodium: 135 mmol/L (ref 135–145)

## 2016-09-09 MED ORDER — OXYCODONE-ACETAMINOPHEN 5-325 MG PO TABS: 1.0000 | ORAL_TABLET | ORAL | 0 refills | Status: DC | PRN

## 2016-09-09 NOTE — Progress Notes (Signed)
Subjective: Patient reports incisional pain min. Has not voided as yet. Had some jello.   Denies back pain Objective: I have reviewed patient's vital signs.  vital signs, intake and output and labs. Vitals:   09/09/16 0000 09/09/16 0431  BP: 107/61 99/62  Pulse: 82 91  Resp: 18 16  Temp: 98.4 F (36.9 C) 98.6 F (37 C)   I/O last 3 completed shifts: In: [id number] [P.O.:55; I.V.:3475] Out: 1025 [Urine:975; Blood:50] No intake/output data recorded.  Lab Results  Component Value Date   WBC 15.6 (H) 09/09/2016   HGB 11.0 (L) 09/09/2016   HCT 31.3 (L) 09/09/2016   MCV 76.0 (L) 09/09/2016   PLT 361 09/09/2016   Lab Results  Component Value Date   CREATININE 0.62 09/09/2016   Path pending  EXAM General: alert, cooperative and no distress Resp: clear to auscultation bilaterally Cardio: regular rate and rhythm, S1, S2 normal, no murmur, click, rub or gallop GI: incision: clean, dry and intact and soft obese active BS throughout Extremities: no edema, redness or tenderness in the calves or thighs Vaginal Bleeding: minimal Back no CVAT Assessment: s/p Procedure(s): ROBOTIC ASSISTED TOTAL HYSTERECTOMY WITH SALPINGECTOMY: stable, progressing well and ok for d/c  after tolerating more po intake and voiding  Plan: Advance diet Encourage ambulation Discharge home if tol po well and voids D/c instructions reviewed.  F/u 2 weeks  LOS: 0 days    Ardie Dragoo A, MD 09/09/2016 7:45 AM    09/09/2016, 7:45 AM

## 2016-09-09 NOTE — Discharge Summary (Signed)
Physician Discharge Summary  Patient ID: Natasha Liu MRN: [id number] DOB/AGE: Mar 06, 1971 45 y.o.  Admit date: 09/08/2016 Discharge date: 09/09/2016  Admission Diagnoses: previous cesarean section x 2, menorrhagia, fibroid uterus  Discharge Diagnoses:  same Active Problems:   S/P hysterectomy   Discharged Condition: stable  Hospital Course: pt was admitted to [facility] where she underwent davinci robotic total hysterectomy,  Bilateral salpingectomy. Pt had uncomplicated postoperative course  Consults: None TX Significant Diagnostic Studies: labs:  CBC    Component Value Date/Time   WBC 15.6 (H) 09/09/2016 0504   RBC 4.12 09/09/2016 0504   HGB 11.0 (L) 09/09/2016 0504   HCT 31.3 (L) 09/09/2016 0504   PLT 361 09/09/2016 0504   MCV 76.0 (L) 09/09/2016 0504   MCH 26.7 09/09/2016 0504   MCHC 35.1 09/09/2016 0504   RDW 13.9 09/09/2016 0504   LYMPHSABS 1.7 07/23/2008 1002   MONOABS 0.4 07/23/2008 1002   EOSABS 0.3 07/23/2008 1002   BASOSABS 0.0 07/23/2008 1002   Lab Results  Component Value Date   CREATININE 0.62 09/09/2016    Treatments: surgery: Davinci robotic total hysterectomy, bilateral salpingectomy  Discharge Exam: Blood pressure 99/62, pulse 91, temperature 98.6 F (37 C), temperature source Oral, resp. rate 16, height 5' (1.524 m), weight 81.4 kg (179 lb 8 oz), last menstrual period 07/31/2016, SpO2 97 %. General appearance: alert, cooperative and no distress Back: no tenderness to percussion or palpation Resp: clear to auscultation bilaterally Cardio: regular rate and rhythm, S1, S2 normal, no murmur, click, rub or gallop GI: soft active BS nondistended Pelvic: deferred Extremities: no edema, redness or tenderness in the calves or thighs Skin: Skin color, texture, turgor normal. No rashes or lesions Incision/Wound: well approximated, c/d/i  Disposition: 01-Home or Self Care  Discharge Instructions    Call MD for:  persistant nausea and vomiting    Complete  by:  As directed    Call MD for:  redness, tenderness, or signs of infection (pain, swelling, redness, odor or green/yellow discharge around incision site)    Complete by:  As directed    Call MD for:  temperature >100.4    Complete by:  As directed    Diet general    Complete by:  As directed    Discharge instructions    Complete by:  As directed    Call if temperature greater than equal to 100.4, nothing per vagina for 4-6 weeks or severe nausea vomiting, increased incisional pain , drainage or redness in the incision site, no straining with bowel movements, showers no bath   Discharge patient    Complete by:  As directed    Increase activity slowly    Complete by:  As directed    May walk up steps    Complete by:  As directed        Medication List    STOP taking these medications   phenazopyridine 100 MG tablet Commonly known as:  PYRIDIUM     TAKE these medications   acetaminophen 325 MG tablet Commonly known as:  TYLENOL Take 650-975 mg by mouth every 6 (six) hours as needed for mild pain, moderate pain or headache.   albuterol 108 (90 Base) MCG/ACT inhaler Commonly known as:  PROVENTIL HFA;VENTOLIN HFA Inhale 2 puffs into the lungs every 4 (four) hours as needed for wheezing or shortness of breath.   ascorbic acid 500 MG tablet Commonly known as:  VITAMIN C Take 1,000 mg by mouth daily as needed (when pt feels  like she is catching a cold).   Biotin 5000 MCG Caps Take 5,000 mcg by mouth every other day.   fexofenadine 180 MG tablet Commonly known as:  ALLEGRA Take 180 mg by mouth daily as needed for allergies.   ibuprofen 800 MG tablet Commonly known as:  ADVIL,MOTRIN Take 800 mg by mouth every 8 (eight) hours as needed.   lisdexamfetamine 30 MG capsule Commonly known as:  VYVANSE Take 30 mg by mouth daily as needed (for ADHD).   multivitamin with minerals Tabs tablet Take 1 tablet by mouth daily.   oxyCODONE-acetaminophen 5-325 MG tablet Commonly  known as:  PERCOCET/ROXICET Take 1-2 tablets by mouth every 4 (four) hours as needed for severe pain (moderate to severe pain (when tolerating fluids)).      Follow-up Information    Emmette Katt A, MD Follow up in 2 week(s).   Specialty:  Obstetrics and Gynecology Contact information: [address] [place] [address] [phone]           Signed: Servando Salina A 09/09/2016, 7:45 AM

## 2016-09-10 NOTE — Op Note (Signed)
Natasha Liu, Natasha Liu NO.:  [id number]  MEDICAL RECORD NO.:  [id number]  LOCATION:                                FACILITY:  [facility]  PHYSICIAN:  Servando Salina, M.D.DATE OF BIRTH:  08/23/1971  DATE OF PROCEDURE:  09/08/2016 DATE OF DISCHARGE:                              OPERATIVE REPORT   PREOPERATIVE DIAGNOSES:  Menorrhagia, previous cesarean section, fibroid uterus.  PROCEDURE: DaVinci robotic total hysterectomy, bilateral salpingectomy.  POSTOPERATIVE DIAGNOSES:  Menorrhagia, previous cesarean section, fibroid uterus.  ANESTHESIA:  General.  SURGEON:  Servando Salina, MD  ASSISTANT:  Derrell Lolling, CNM  DESCRIPTION OF PROCEDURE:  Under adequate general anesthesia, patient is positioned for robotic surgery.  She was placed in a dorsal lithotomy position.  Examination under anesthesia revealed a mobile about 13 week size irregular uterus.  No adnexal masses could be appreciated, but limited by the patient's uterine size.  The patient was sterilely prepped and draped in usual fashion.  A 3-way Foley catheter was sterilely placed draining orange Pyridium colored urine.  Weighted speculum was placed in the vagina.  Sims retractor was placed anteriorly.  The cervix was medium size, 0 Vicryl figure-of-eight sutures was placed on the anterior and posterior lip of the cervix.  The cervix was then serially dilated.  A #8 uterine manipulator was introduced into the uterine cavity using a medium RUMI cup.  The retractors were removed.  Attention was then turned to the abdomen. Supraumbilical incision was made after 0.25% Marcaine was injected. Veress needle was introduced.  Opening pressure of 8 was noted, 2 L of CO2 was insufflated.  The Veress needle had been tested with a drop test.  The 12 mm disposable trocar with sleeve was introduced into the abdomen without incident.  The robotic camera was then inserted. Panoramic inspection was notable for an  irregular fibroid uterus with limitation of mobility on the left due to bowels and adhesion.  Normal liver edge was noted.  The additional port sites were then placed two 10 cm apart on the left, 8 mm  right lower quadrant, AirSeal was placed and in between the robotic camera and the AirSeal site was an another robotic port site placed equidistant.  The robot was docked to the patient's left side. Under direct visualization, the robotic instruments were then placed. In arm #1 monopolar scissors, arm #2, PK dissector, arm #3, the ProGrasp.  I then went to the surgical console after inspection of the pelvis further and displacing the bowels upwardly.  Once at the surgical console, the pelvis was further inspected.  Initially, the left ureter was not seen.  There was surgical separation of the fallopian tube noted on the left.  The ovary had a small cyst, but otherwise normal.  The procedure was started with the opening the retroperitoneal space, cauterizing serially the utero-ovarian ligament on the left.  The round ligament was then cauterized then cut and brought around to the bladder vesicouterine peritoneum which had some adhesions from her previous cesarean section.  Both the anterior and and posterior leaf of broad ligament was opened further and displaced inferiorly.  The attention was then turned to the  bladder which had large amount of varicosities on the inferior aspect.  A transverse careful dissection was then made of the vesicouterine peritoneum and sharp dissection was then utilized to bring the bladder, displaced inferiorly over the RUMI cup.  At that point, the uterine vessels could be seen better, they were serially clamped, cauterized, and then subsequently cut.  Attention was then turned to the opposite side. Ureter was seen peristalsing very well.  The fallopian tube had surgical separation as well.  It was brought up into the field and the underlying mesosalpinx was  serially clamped, cauterized, and then cut.  Removal of that tube was then performed.  The retroperitoneal space on the right was then opened.  The right utero-ovarian ligament was serially clamped, cauterized, and then cut, opened the posterior peritoneal space and then the round ligament was then serially clamped, cauterized, and cut continuing anteriorly to the anterior vesicouterine peritoneum.  The uterine vessel was noted to be tortuous, continue open up the posterior broad ligament was then continued until the uterine vessels were better exposed at which time, they were serially clamped, cauterized, and cut as well.  Once this was done, the bladder was further displaced inferiorly and a circumferential incision was then made at the cervicovaginal junction, carried around on the top of the RUMI cup.  The uterus was then detached from its vaginal attachment.  This in large size was brought down, attempted initially delivered through the vagina was unsuccessful.  At that point, I left the surgical console and went to morcellate the specimen.  The cervix was in the vagina.  The manipulators were removed.  Retractors were then placed.  Scalp was then used to open the cervix done in the midline and then coring medially, removed the cervix as well as a part of the uterus and then subsequently delivered the specimen.  Prior to removing the specimen, attention was then turned back to the left fallopian tube which was also grasped.  The underlying mesosalpinx was serially clamped, cauterized, and then cut and removal of tubes as well.  Once the specimen was out, the vaginal insufflator was then reinserted.  I went back to the surgical console. Inspecting the pelvis, it was noted to have a small extension of the vagina anteriorly which was going to need to be closed separately.  At that point, the vaginal cuff was irrigated, suctioned, small amount of bleeding was noted, but not actively.  The  ureters were noted to be peristalsing bilaterally.  The instruments were then replaced with the PK and the monopolar scissors by the long-tipped forceps and the large suture Mega needle driver, 0 Vicryl suture was then placed which was then used to close the vaginal extension.  The 0 V-Loc suture x2 was then used to close the vaginal cuff.  The site was then irrigated and suctioned.  Intraoperative palpation of the vaginal cuff showed good approximation. The abdomen was then partially deflated and the bleeding site was noted on the left which was isolated, cauterized, some bleeding on the peritoneal edges were cauterized as well.  Reinspection of the both adnexa showed good hemostasis.  The pelvis was irrigated and suctioned.  When adequate hemostasis was achieved, the robotic instruments were removed.  The robot was undocked.  I then went back sterilely to the patient's bedside, reinspected the pelvis, aspirated any fluid and the potato starch was then placed on the pelvic vaginal cuff area.  The robotic port sites were then removed.  The AirSeal  was removed.  The abdomen was deflated and the incisions were then closed with the supraumbilical site closed with 0 Vicryl figure-of-eight fascial stitch and incisions were closed with 4-0 Vicryl subcuticular closures.  I then went back to the vaginal cuff, both digital and speculum inspection was then performed with good hemostasis and approximation noted.  Specimen was uterus, cervix with tubes sent to Pathology.  Estimated blood loss was 50 mL.  INTRAOPERATIVE FLUIDS:  1 L.  URINE OUTPUT:  150 mL  Pyridium stained fluid urine.  Sponge and instrument counts x2 was correct.  COMPLICATION:  None.  The patient tolerated the procedure well, was transferred to recovery room in stable condition.     Servando Salina, M.D.   ______________________________ Servando Salina, M.D.    [place]/MEDQ  D:  09/09/2016  T:  09/10/2016  Job:   [id number]

## 2016-09-10 NOTE — Op Note (Deleted)
  The note originally documented on this encounter has been moved the the encounter in which it belongs.  

## 2018-08-07 ENCOUNTER — Emergency Department (HOSPITAL_COMMUNITY): Payer: BC Managed Care – PPO

## 2018-08-07 ENCOUNTER — Encounter (HOSPITAL_COMMUNITY): Payer: Self-pay | Admitting: Emergency Medicine

## 2018-08-07 ENCOUNTER — Emergency Department (HOSPITAL_COMMUNITY)
Admission: EM | Admit: 2018-08-07 | Discharge: 2018-08-08 | Disposition: A | Discharge status: Home / Self Care | Payer: BC Managed Care – PPO | Attending: Emergency Medicine | Admitting: Emergency Medicine

## 2018-08-07 DIAGNOSIS — M545 Low back pain: Secondary | ICD-10-CM | POA: Diagnosis present

## 2018-08-07 DIAGNOSIS — M5442 Lumbago with sciatica, left side: Secondary | ICD-10-CM | POA: Insufficient documentation

## 2018-08-07 LAB — POC URINE PREG, ED: PREG TEST UR: NEGATIVE

## 2018-08-07 MED ORDER — METHOCARBAMOL 500 MG PO TABS: 500.0000 mg | ORAL_TABLET | Freq: Two times a day (BID) | ORAL | 0 refills | Status: DC

## 2018-08-07 MED ORDER — PREDNISONE 10 MG PO TABS: 40.0000 mg | ORAL_TABLET | Freq: Every day | ORAL | 0 refills | Status: AC

## 2018-08-07 NOTE — Discharge Instructions (Addendum)
Please return to the Emergency Department for any new or worsening symptoms or if your symptoms do not improve. Please be sure to follow up with your Primary Care Physician as soon as possible regarding your visit today. If you do not have a Primary Doctor please use the resources below to establish one. You may use the medication Robaxin as prescribed.  This is a muscle relaxer that can make you drowsy.  Please do not drive or operate heavy machinery while taking Robaxin. You may use the steroid medication prednisone as prescribed, 40 mg a day for 5 days. You may use the over-the-counter Tylenol and ibuprofen as directed on the packaging to help with your pain.   Your x-ray today showed some arthrosis of your joints, please discuss this with your primary care provider.  Contact a doctor if: You have pain that: Wakes you up when you are sleeping. Gets worse when you lie down. Is worse than the pain you have had in the past. Lasts longer than 4 weeks. You lose weight for without trying. Get help right away if: You cannot control when you pee (urinate) or poop (have a bowel movement). You have weakness in any of these areas and it gets worse. Lower back. Lower belly (pelvis). Butt (buttocks). Legs. You have redness or swelling of your back. You have a burning feeling when you pee.  Do not take your medicine if  develop an itchy rash, swelling in your mouth or lips, or difficulty breathing.   RESOURCE GUIDE  Chronic Pain Problems: Contact [facility]  [phone] Patients need to be referred by their primary care doctor.  Insufficient Money for Medicine: Contact [employer] Way:  call "211" or [place] [phone].  No Primary Care Doctor: Call [facility]  [phone] - can help you locate a primary care doctor that  accepts your insurance, provides certain services, etc. Physician Referral Service- [phone]  Agencies that provide inexpensive medical  care: [facility]  [facility]  [phone] [facility]  [phone] [facility]  [phone] [facility]  [phone] [facility]  [phone]  [place] Providers: [facility]- [address], [address]  [phone], Mon-Fri 9am-7pm, Sat 9am-1pm [place], [address] [place]  [place], [address] [place]  [place]- [address]  [place], [address], [phone]  Only accepts [place] patients after they have their name  applied to their card  Self Pay (no insurance) in [facility]: Sickle Cell Patients: Dr Kevan Ny, [facility]  [place], [facility]- [place]  [facility] [place]- [address], [facility]- see information above (Speak to [employer] if you do not have insurance)       -  [facility]- [place], [place],  [place]       -  [facility]- [address], [phone]       -  Dr Vista Lawman-  [phone] [address], [address], [place], [place] [facility]- [address], [phone]       -  [facility] [place]- [address], [place], also [address] [phone]       -    [facility]- [address], [place], 1st & 3rd Saturday   every month, 10am-1pm  1) Find a Doctor and Pay Out of Pocket Although you won't have to find out who is covered by your insurance plan, it is a good idea to ask around and get recommendations. You will then need to call the office and see if the doctor you have chosen will accept you as a new patient and what types of  options they offer for patients who are self-pay. Some doctors offer discounts or will set up payment plans for their patients who do not have insurance, but you will need to ask so you aren't surprised when you get to your appointment.  2) Contact Your Local Health Department Not all health departments have doctors that can see patients for sick visits, but many do, so it is worth a call to see if yours does. If you don't know where your local health department is, you can check in your phone book. The CDC also has a tool to help you locate your state's health department, and many state websites also have listings of all of their local health departments.  3) Find a [facility] If your illness is not likely to be very severe or complicated, you may want to try a walk in clinic. These are popping up all over the country in pharmacies, drugstores, and shopping centers. They're usually staffed by nurse practitioners or physician assistants that have been trained to treat common illnesses and complaints. They're usually fairly quick and inexpensive. However, if you have serious medical issues or chronic medical problems, these are probably not your best option  [facility] [place], [facility], [address], [place], phone [phone] or [phone].  Monday - Friday, call for an appointment. [place], [facility], [address], [place], phone [phone] or [phone].  Monday - Friday, call for an appointment.  Abuse/Neglect: [place] [phone] [place] [phone] (After Hours)  Emergency Shelter:  [facility] [phone]  Maternity Homes: [facility] at the [place] [phone] [place] [phone]  MRSA Hotline #:   (418)481-9439  [facility]  [place] Dept. [address] [place]         [place] Phone:  [phone]  Phone:  [phone]                   Phone:  [place], [place]- [phone]       -     [facility] in [place], [address]                                  [place] [phone] or [phone] (After Hours)   [place]  Substance Abuse Resources: [facility]  [phone] [place] [phone] [facility] [place] Chinita Pester [phone] Residential & Outpatient Substance Abuse Program  [phone]  Psychological Services: [place]  [phone] [place]  [place], [place]. [address] [place], [place]: [phone] or [phone], [employer]  Dental Assistance  If unable to pay or uninsured, contact:  [facility] or [facility]. to become qualified for the adult dental clinic.  Patients with Medicaid: [facility] [phone] [address], [place] [address], [phone]  If unable to pay, or uninsured, contact [facility] [phone]) or [place] [phone] in [place], [place] in [address]) to become qualified for the adult dental clinic   Other [place]- [place], [place], [place], [address], [place], [place], 2nd and 4th Thursday of the month at 6:30am.  10 clients each day by appointment, can sometimes see walk-in patients if  someone does not show for an appointment. [facility]- [address] Hillard Danker [place], [place], [address], [place], [place], [place], [address], [place] Department- [phone] [facility] [phone]

## 2018-08-07 NOTE — ED Provider Notes (Signed)
[facility] EMERGENCY DEPARTMENT Provider Note   CSN: [id number] Arrival date & time: 08/07/18  1638     History   Chief Complaint Chief Complaint  Patient presents with  . Back Pain  . Numbness  . Spasms    HPI Natasha Liu is a 47 y.o. female presenting for left-sided lower back pain that radiates down left leg.  Patient states that her problem began at the beginning of this month.  Patient denies injury or trauma however states that she did help her mother move and pain began sometime after.  Patient describes her pain as a moderate in intensity burning sensation that radiates down to her left lower leg.  Patient states that she has felt some tingling sensation to her lower leg at comes on randomly.  Patient states that she was seen by her primary care provider twice and received a cortisone shot in her left hip for bursitis 2 weeks ago.  Patient states that this did not help relieve her symptoms.  Patient presented to the emergency department today because her primary care doctor's office was closed and she was having increased tingling to her left lower leg.  Patient denies fever, IV drug use, history of cancer, saddle area paresthesias, bowel/bladder incontinence, extremity weakness, injury/trauma.  HPI  Past Medical History:  Diagnosis Date  . Adult ADHD   . Bronchitis    lasted 8-10 weeks  . Constipation   . Gestational diabetes   . Inguinal hernia   . Irregular heart beat   . MRSA infection    boils  . Pre-diabetes   . Toxemia in pregnancy   . Vitiligo    under breast and underwear areas    Patient Active Problem List   Diagnosis Date Noted  . S/P hysterectomy 09/08/2016    Past Surgical History:  Procedure Laterality Date  . CESAREAN SECTION    . MOUTH SURGERY    . ROBOTIC ASSISTED TOTAL HYSTERECTOMY WITH SALPINGECTOMY Bilateral 09/08/2016   Procedure: ROBOTIC ASSISTED TOTAL HYSTERECTOMY WITH SALPINGECTOMY;  Surgeon: Servando Salina,  MD;  Location: [facility];  Service: Gynecology;  Laterality: Bilateral;  . WISDOM TOOTH EXTRACTION       OB History   None      Home Medications    Prior to Admission medications   Medication Sig Start Date End Date Taking? Authorizing Provider  acetaminophen (TYLENOL) 325 MG tablet Take 650-975 mg by mouth every 6 (six) hours as needed for mild pain, moderate pain or headache.    [provider]  albuterol (PROVENTIL HFA;VENTOLIN HFA) 108 (90 Base) MCG/ACT inhaler Inhale 2 puffs into the lungs every 4 (four) hours as needed for wheezing or shortness of breath. 04/09/16   Lysbeth Penner, FNP  ascorbic acid (VITAMIN C) 500 MG tablet Take 1,000 mg by mouth daily as needed (when pt feels like she is catching a cold).     [provider]  Biotin 5000 MCG CAPS Take 5,000 mcg by mouth every other day.     [provider]  fexofenadine (ALLEGRA) 180 MG tablet Take 180 mg by mouth daily as needed for allergies.     [provider]  ibuprofen (ADVIL,MOTRIN) 800 MG tablet Take 800 mg by mouth every 8 (eight) hours as needed.    [provider]  lisdexamfetamine (VYVANSE) 30 MG capsule Take 30 mg by mouth daily as needed (for ADHD).    [provider]  methocarbamol (ROBAXIN) 500 MG tablet Take  1 tablet (500 mg total) by mouth 2 (two) times daily. 08/07/18   Nuala Alpha A, PA-C  Multiple Vitamin (MULTIVITAMIN WITH MINERALS) TABS tablet Take 1 tablet by mouth daily.    [provider]  oxyCODONE-acetaminophen (PERCOCET/ROXICET) 5-325 MG tablet Take 1-2 tablets by mouth every 4 (four) hours as needed for severe pain (moderate to severe pain (when tolerating fluids)). 09/09/16   Servando Salina, MD  predniSONE (DELTASONE) 10 MG tablet Take 4 tablets (40 mg total) by mouth daily for 5 days. 08/07/18 08/12/18  Deliah Boston, PA-C    Family History History reviewed. No pertinent family history.  Social History Social History    Tobacco Use  . Smoking status: Never Smoker  . Smokeless tobacco: Never Used  Substance Use Topics  . Alcohol use: Yes    Comment: ocassionally  . Drug use: No     Allergies   Aspirin and Other   Review of Systems Review of Systems  Constitutional: Negative.  Negative for chills and fever.  Musculoskeletal: Positive for back pain. Negative for joint swelling and neck pain.  Skin: Negative.  Negative for color change.  Neurological: Positive for numbness. Negative for syncope, weakness and headaches.       Denies saddle area paresthesias Denies bowel/bladder incontinence   Physical Exam Updated Vital Signs BP (!) 148/99 (BP Location: Right Arm)   Pulse 89   Temp 98.9 F (37.2 C) (Oral)   Resp 18   SpO2 100%   Physical Exam  Constitutional: She appears well-developed and well-nourished. No distress.  HENT:  Head: Normocephalic and atraumatic.  Right Ear: External ear normal.  Left Ear: External ear normal.  Nose: Nose normal.  Eyes: Pupils are equal, round, and reactive to light. EOM are normal.  Neck: Trachea normal and normal range of motion. No tracheal deviation present.  Cardiovascular:  Pulses:      Dorsalis pedis pulses are 2+ on the right side, and 2+ on the left side.       Posterior tibial pulses are 2+ on the right side, and 2+ on the left side.  Pulmonary/Chest: Effort normal. No respiratory distress.  Abdominal: Soft. There is no tenderness. There is no rebound and no guarding.  Musculoskeletal: Normal range of motion.       Right knee: Normal.       Left knee: Normal.       Right ankle: Normal.       Left ankle: Normal.       Cervical back: Normal.       Thoracic back: Normal.       Lumbar back: Normal. She exhibits no tenderness, no bony tenderness, no edema and no deformity.       Right lower leg: Normal.       Left lower leg: Normal.       Legs:      Right foot: Normal.       Left foot: Normal.  No midline spinal tenderness to palpation.   No crepitus or step-off or deformity of the spine noted.  No paraspinal muscular tenderness to palpation.  Patient with mild left gluteal tenderness to palpation.  Feet:  Right Foot:  Protective Sensation: 3 sites tested. 3 sites sensed.  Left Foot:  Protective Sensation: 3 sites tested. 3 sites sensed.  Neurological: She is alert. GCS eye subscore is 4. GCS verbal subscore is 5. GCS motor subscore is 6.  Speech is clear and goal oriented, follows commands Major Cranial nerves  without deficit, no facial droop Normal strength in upper and lower extremities bilaterally including dorsiflexion and plantar flexion, strong and equal grip strength Sensation normal to light touch Moves extremities without ataxia, coordination intact Normal gait  Skin: Skin is warm and dry. Capillary refill takes less than 2 seconds.  Psychiatric: She has a normal mood and affect. Her behavior is normal.   ED Treatments / Results  Labs (all labs ordered are listed, but only abnormal results are displayed) Labs Reviewed  POC URINE PREG, ED    EKG None  Radiology Dg Lumbar Spine Complete  Result Date: 08/07/2018 CLINICAL DATA:  Lower back pain radiating down left leg. EXAM: LUMBAR SPINE - COMPLETE 4+ VIEW COMPARISON:  None. FINDINGS: There are 5 non ribbed lumbar type vertebral bodies in normal lumbar lordosis. Mild sclerosis of the facets of the lumbar spine consistent with degenerative facet arthropathy. Disc spaces are maintained. Small anterior osteophytes are noted along the lumbar spine, most prominent at L3 and L4. No pars defects or listhesis. No pelvic diastasis of the sacroiliac joints are maintained bilaterally. Pelvic phleboliths are noted bilaterally within the included pelvis. The overlying bowel is unremarkable. IMPRESSION: Lumbar facet arthrosis.  No acute osseous abnormality. Electronically Signed   By: Ashley Royalty M.D.   On: 08/07/2018 22:52    Procedures Procedures (including critical care  time)  Medications Ordered in ED Medications - No data to display   Initial Impression / Assessment and Plan / ED Course  I have reviewed the triage vital signs and the nursing notes.  Pertinent labs & imaging results that were available during my care of the patient were reviewed by me and considered in my medical decision making (see chart for details).     Patient presenting with left-sided low back pain with sciatica. No neurological deficits and normal neuro exam.  Patient can walk but states is painful.  Patient denies loss of bowel/bladder control or saddle area paresthesias.  No concern for cauda equina.  No fever, night sweats, weight loss, h/o cancer, or IVDU.  Urine pregnant negative. Imaging of lumbar spine:  IMPRESSION:  Lumbar facet arthrosis. No acute osseous abnormality.   Patient informed of x-ray findings  Patient given short course of prednisone, patient denies history of diabetes.  Patient also given muscle relaxer, informed not to drive or operate machinery while taking Robaxin. Patient also informed that she may use over-the-counter ibuprofen or Tylenol as directed on the packaging.  Patient afebrile, not tachycardic, not hypotensive, well-appearing in no acute distress.  At this time there does not appear to be any evidence of an acute emergency medical condition and the patient appears stable for discharge with appropriate outpatient follow up. Diagnosis was discussed with patient who verbalizes understanding of care plan and is agreeable to discharge. I have discussed return precautions with patient who verbalizes understanding of return precautions. Patient strongly encouraged to follow-up with their PCP. All questions answered.   Note: Portions of this report may have been transcribed using voice recognition software. Every effort was made to ensure accuracy; however, inadvertent computerized transcription errors may still be present.  Final Clinical  Impressions(s) / ED Diagnoses   Final diagnoses:  Left-sided low back pain with left-sided sciatica, unspecified chronicity    ED Discharge Orders         Ordered    methocarbamol (ROBAXIN) 500 MG tablet  2 times daily     08/07/18 2326    predniSONE (DELTASONE) 10 MG tablet  Daily  08/07/18 2326           Deliah Boston, PA-C 08/08/18 0117    Drenda Freeze, MD 08/08/18 [phone]

## 2018-08-07 NOTE — ED Triage Notes (Signed)
Pt presents with L lower back pain that radiates down L leg and also has some numbness to the lateral aspect and muscle spasms; began 07/22/18, saw PCP on 07/29/18 for cortisone shot

## 2018-08-08 MED ORDER — METHOCARBAMOL 500 MG PO TABS
500.0000 mg | ORAL_TABLET | Freq: Once | ORAL | Status: AC
  Administered 2018-08-08: 500 mg via ORAL
  Filled 2018-08-08: qty 1

## 2019-11-12 IMAGING — CR DG LUMBAR SPINE COMPLETE 4+V
5 series · 5 of 5 positions shown · non-contrast
Comparison: None.

CLINICAL DATA: Lower back pain radiating down left leg.

EXAM:
LUMBAR SPINE - COMPLETE 4+ VIEW

[l-spine ap]
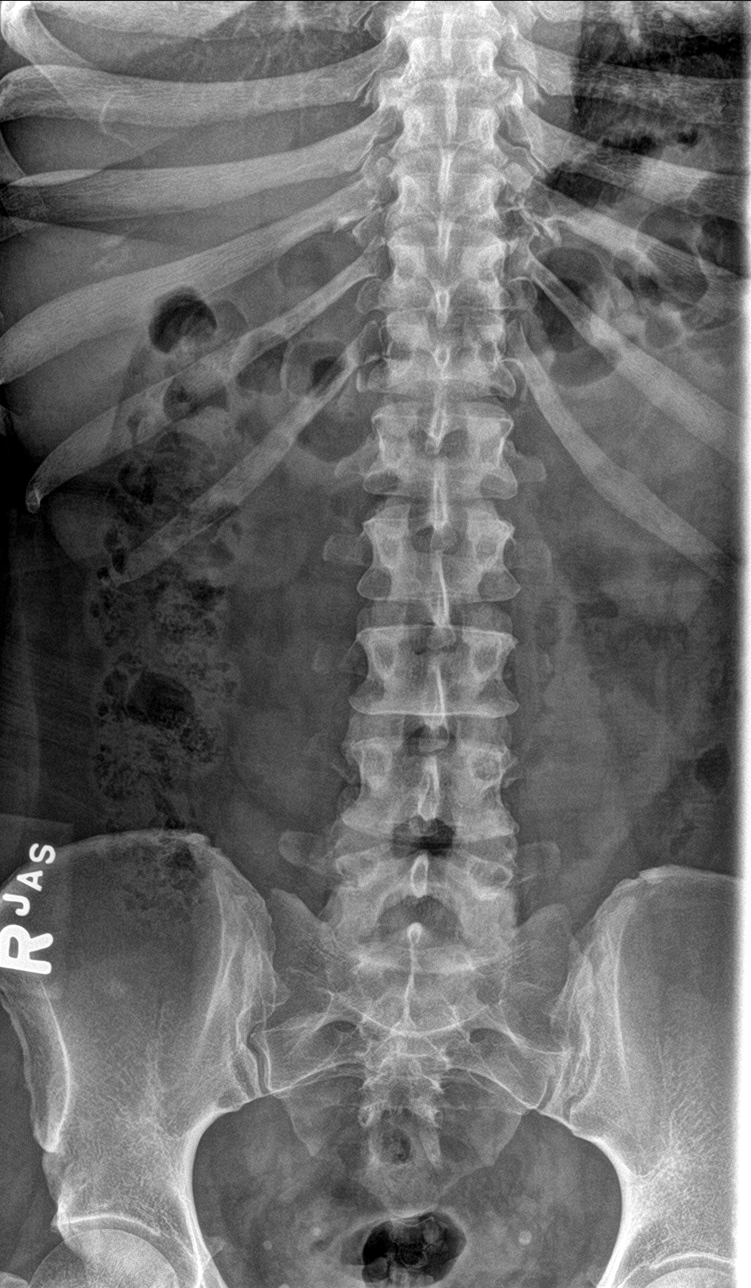

[l-spine obl (1 of 2)]
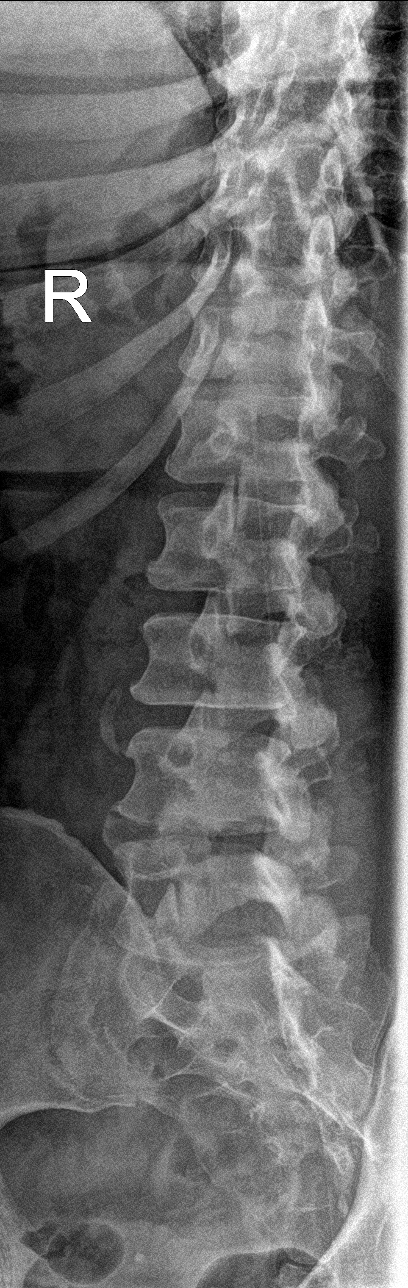

[l-spine obl (2 of 2)]
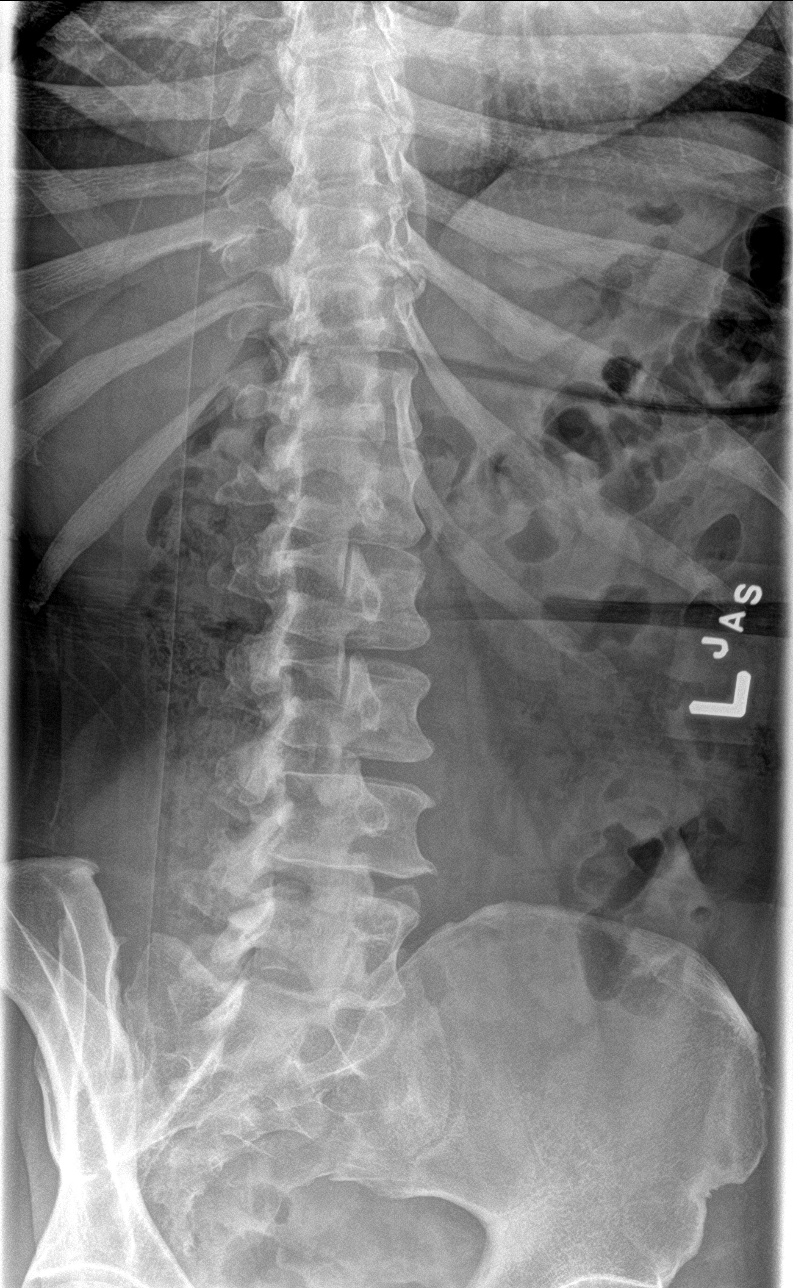

[l-spine lat]
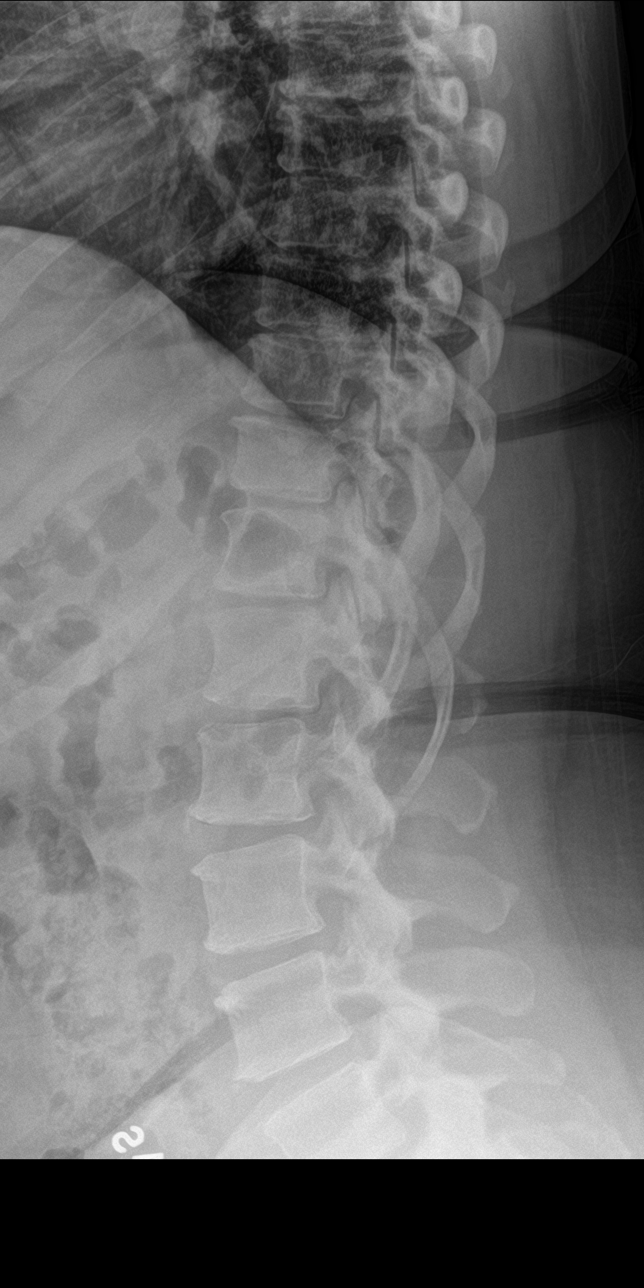

[l-spine spot]
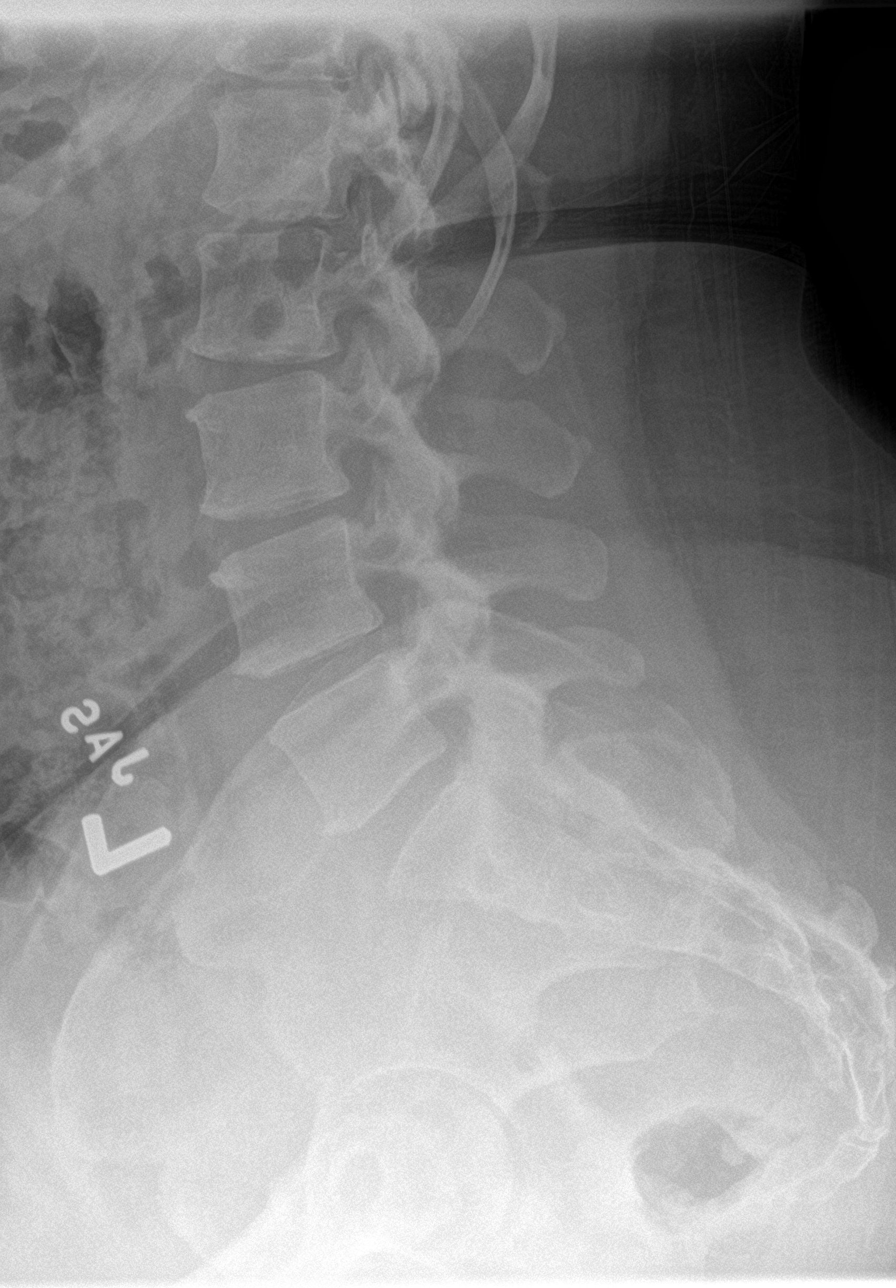

[5 of 5 positions shown; findings below may reference images not displayed]

FINDINGS: There are 5 non ribbed lumbar type vertebral bodies in normal lumbar
lordosis. Mild sclerosis of the facets of the lumbar spine
consistent with degenerative facet arthropathy. Disc spaces are
maintained. Small anterior osteophytes are noted along the lumbar
spine, most prominent at L3 and L4. No pars defects or listhesis. No
pelvic diastasis of the sacroiliac joints are maintained
bilaterally. Pelvic phleboliths are noted bilaterally within the
included pelvis. The overlying bowel is unremarkable.
IMPRESSION: Lumbar facet arthrosis.  No acute osseous abnormality.

## 2022-04-15 ENCOUNTER — Emergency Department (HOSPITAL_BASED_OUTPATIENT_CLINIC_OR_DEPARTMENT_OTHER)
Admission: EM | Admit: 2022-04-15 | Discharge: 2022-04-15 | Disposition: A | Discharge status: Home / Self Care | Payer: BC Managed Care – PPO | Attending: Emergency Medicine | Admitting: Emergency Medicine

## 2022-04-15 ENCOUNTER — Emergency Department (HOSPITAL_BASED_OUTPATIENT_CLINIC_OR_DEPARTMENT_OTHER): Payer: BC Managed Care – PPO | Admitting: Radiology

## 2022-04-15 ENCOUNTER — Other Ambulatory Visit: Payer: Self-pay

## 2022-04-15 ENCOUNTER — Emergency Department (HOSPITAL_BASED_OUTPATIENT_CLINIC_OR_DEPARTMENT_OTHER): Payer: BC Managed Care – PPO

## 2022-04-15 ENCOUNTER — Encounter (HOSPITAL_BASED_OUTPATIENT_CLINIC_OR_DEPARTMENT_OTHER): Payer: Self-pay

## 2022-04-15 DIAGNOSIS — I959 Hypotension, unspecified: Secondary | ICD-10-CM | POA: Diagnosis not present

## 2022-04-15 DIAGNOSIS — R079 Chest pain, unspecified: Secondary | ICD-10-CM | POA: Insufficient documentation

## 2022-04-15 DIAGNOSIS — R197 Diarrhea, unspecified: Secondary | ICD-10-CM | POA: Insufficient documentation

## 2022-04-15 LAB — BASIC METABOLIC PANEL
Anion gap: 11 (ref 5–15)
BUN: 17 mg/dL (ref 6–20)
CO2: 26 mmol/L (ref 22–32)
Calcium: 8.9 mg/dL (ref 8.9–10.3)
Chloride: 99 mmol/L (ref 98–111)
Creatinine, Ser: 0.7 mg/dL (ref 0.44–1.00)
GFR, Estimated: >60 mL/min (ref >60)
Glucose, Bld: 147 mg/dL — ABNORMAL HIGH (ref 70–99)
Potassium: 3.7 mmol/L (ref 3.5–5.1)
Sodium: 136 mmol/L (ref 135–145)

## 2022-04-15 LAB — CBC
HCT: 36.2 % (ref 36.0–46.0)
Hemoglobin: 12.4 g/dL (ref 12.0–15.0)
MCH: 27.3 pg (ref 26.0–34.0)
MCHC: 34.3 g/dL (ref 30.0–36.0)
MCV: 79.6 fL — ABNORMAL LOW (ref 80.0–100.0)
Platelets: 255 10*3/uL (ref 150–400)
RBC: 4.55 MIL/uL (ref 3.87–5.11)
RDW: 14.8 % (ref 11.5–15.5)
WBC: 9.3 10*3/uL (ref 4.0–10.5)
nRBC: 0 % (ref 0.0–0.2)

## 2022-04-15 LAB — HEPATIC FUNCTION PANEL
ALT: 27 U/L (ref 0–44)
AST: 15 U/L (ref 15–41)
Albumin: 3.9 g/dL (ref 3.5–5.0)
Alkaline Phosphatase: 47 U/L (ref 38–126)
Bilirubin, Direct: 0.1 mg/dL (ref 0.0–0.2)
Indirect Bilirubin: 0.7 mg/dL (ref 0.3–0.9)
Total Bilirubin: 0.8 mg/dL (ref 0.3–1.2)
Total Protein: 6.8 g/dL (ref 6.5–8.1)

## 2022-04-15 LAB — URINALYSIS, ROUTINE W REFLEX MICROSCOPIC
Bilirubin Urine: NEGATIVE
Glucose, UA: NEGATIVE mg/dL
Hgb urine dipstick: NEGATIVE
Ketones, ur: NEGATIVE mg/dL
Leukocytes,Ua: NEGATIVE
Nitrite: NEGATIVE
Specific Gravity, Urine: 1.024 (ref 1.005–1.030)
pH: 6 (ref 5.0–8.0)

## 2022-04-15 LAB — PREGNANCY, URINE: Preg Test, Ur: NEGATIVE

## 2022-04-15 LAB — CBG MONITORING, ED: Glucose-Capillary: 160 mg/dL — ABNORMAL HIGH (ref 70–99)

## 2022-04-15 LAB — TROPONIN I (HIGH SENSITIVITY)
Troponin I (High Sensitivity): 2 ng/L (ref <18)
Troponin I (High Sensitivity): 2 ng/L (ref <18)

## 2022-04-15 LAB — LIPASE, BLOOD: Lipase: 52 U/L — ABNORMAL HIGH (ref 11–51)

## 2022-04-15 MED ORDER — SODIUM CHLORIDE 0.9 % IV SOLN
1000.0000 mL | INTRAVENOUS | Status: DC
  Administered 2022-04-15: 1000 mL via INTRAVENOUS

## 2022-04-15 MED ORDER — PANTOPRAZOLE SODIUM 20 MG PO TBEC: 20.0000 mg | DELAYED_RELEASE_TABLET | Freq: Every day | ORAL | 2 refills | Status: DC

## 2022-04-15 MED ORDER — SODIUM CHLORIDE 0.9 % IV BOLUS (SEPSIS)
1000.0000 mL | Freq: Once | INTRAVENOUS | Status: AC
  Administered 2022-04-15: 1000 mL via INTRAVENOUS

## 2022-04-15 MED ORDER — SODIUM CHLORIDE 0.9 % IV BOLUS
1000.0000 mL | Freq: Once | INTRAVENOUS | Status: AC
  Administered 2022-04-15: 1000 mL via INTRAVENOUS

## 2022-04-15 MED ORDER — IOHEXOL 350 MG/ML SOLN
100.0000 mL | Freq: Once | INTRAVENOUS | Status: AC | PRN
  Administered 2022-04-15: 100 mL via INTRAVENOUS

## 2022-04-15 MED ORDER — LIDOCAINE VISCOUS HCL 2 % MT SOLN
15.0000 mL | Freq: Once | OROMUCOSAL | Status: AC
  Administered 2022-04-15: 15 mL via ORAL
  Filled 2022-04-15: qty 15

## 2022-04-15 MED ORDER — PREDNISONE 20 MG PO TABS
30.0000 mg | ORAL_TABLET | Freq: Once | ORAL | Status: AC
  Administered 2022-04-15: 30 mg via ORAL
  Filled 2022-04-15: qty 1

## 2022-04-15 MED ORDER — ALUM & MAG HYDROXIDE-SIMETH 200-200-20 MG/5ML PO SUSP
30.0000 mL | Freq: Once | ORAL | Status: AC
  Administered 2022-04-15: 30 mL via ORAL
  Filled 2022-04-15: qty 30

## 2022-04-15 NOTE — ED Triage Notes (Signed)
Pt BIB S/O from home c/o right sided chest pain described as pressure with radiation to the back associated with nausea, SOB, generalized weakness, diarrhea, and bilateral arm tingling onset yesterday afternoon. No cardiac HX.

## 2022-04-15 NOTE — ED Notes (Signed)
Delay in triage pt wanted to go to the bathroom. Was assisted by NT Roselyn Reef

## 2022-04-15 NOTE — ED Provider Notes (Addendum)
[facility]PT Provider Note  CSN: [id number] Arrival date & time: 04/15/22 0343  Chief Complaint(s) Chest Pain and Hypotension  HPI Natasha Liu is a 51 y.o. female with a past medical history listed below including vitiligo currently on long-term prednisone who presents to the emergency department for sudden onset right-sided chest pain described as stabbing pressure radiates to the back. Pain started about 6-8 hours ago.  Constant but fluctuating in nature.  Nonexertional.  Not associated with shortness of breath.  She endorses nausea without emesis.  Additionally patient reports that she has been having some loose stools since yesterday afternoon.  Stools are yellow and not melenic or bloody.  She denies any associated abdominal pain.  No recent antibiotics or suspicious food intake.  No recent fevers or infections.  No coughing or congestion.  No urinary symptoms.   The history is provided by the patient and medical records.    Past Medical History Past Medical History:  Diagnosis Date   Adult ADHD    Bronchitis    lasted 8-10 weeks   Constipation    Gestational diabetes    Inguinal hernia    Irregular heart beat    MRSA infection    boils   Pre-diabetes    Toxemia in pregnancy    Vitiligo    under breast and underwear areas   Patient Active Problem List   Diagnosis Date Noted   S/P hysterectomy 09/08/2016   Home Medication(s) Prior to Admission medications   Medication Sig Start Date End Date Taking? Authorizing Provider  pantoprazole (PROTONIX) 20 MG tablet Take 1 tablet (20 mg total) by mouth daily. 04/15/22  Yes Audre Cenci, Grayce Sessions, MD  acetaminophen (TYLENOL) 325 MG tablet Take 650-975 mg by mouth every 6 (six) hours as needed for mild pain, moderate pain or headache.    [provider]  albuterol (PROVENTIL HFA;VENTOLIN HFA) 108 (90 Base) MCG/ACT inhaler Inhale 2 puffs into the lungs every 4 (four) hours as needed for wheezing or  shortness of breath. 04/09/16   Lysbeth Penner, FNP  ascorbic acid (VITAMIN C) 500 MG tablet Take 1,000 mg by mouth daily as needed (when pt feels like she is catching a cold).     [provider]  Biotin 5000 MCG CAPS Take 5,000 mcg by mouth every other day.     [provider]  fexofenadine (ALLEGRA) 180 MG tablet Take 180 mg by mouth daily as needed for allergies.     [provider]  ibuprofen (ADVIL,MOTRIN) 800 MG tablet Take 800 mg by mouth every 8 (eight) hours as needed.    [provider]  lisdexamfetamine (VYVANSE) 30 MG capsule Take 30 mg by mouth daily as needed (for ADHD).    [provider]  methocarbamol (ROBAXIN) 500 MG tablet Take 1 tablet (500 mg total) by mouth 2 (two) times daily. 08/07/18   Nuala Alpha A, PA-C  Multiple Vitamin (MULTIVITAMIN WITH MINERALS) TABS tablet Take 1 tablet by mouth daily.    [provider]  oxyCODONE-acetaminophen (PERCOCET/ROXICET) 5-325 MG tablet Take 1-2 tablets by mouth every 4 (four) hours as needed for severe pain (moderate to severe pain (when tolerating fluids)). 09/09/16   Servando Salina, MD  Allergies Aspirin and Other  Review of Systems Review of Systems As noted in HPI  Physical Exam Vital Signs  I have reviewed the triage vital signs BP 74/63   Pulse 86  Temp 98.1 F (36.7 C)   Resp 15   Ht 5' (1.524 m)   Wt 81.6 kg   SpO2 97%   BMI 35.15 kg/m   Physical Exam Vitals reviewed.  Constitutional:      General: She is not in acute distress.    Appearance: She is well-developed. She is ill-appearing. She is not diaphoretic.  HENT:     Head: Normocephalic and atraumatic.     Nose: Nose normal.  Eyes:     General: No scleral icterus.       Right eye: No discharge.        Left eye: No discharge.     Conjunctiva/sclera:  Conjunctivae normal.     Pupils: Pupils are equal, round, and reactive to light.  Cardiovascular:     Rate and Rhythm: Normal rate and regular rhythm.     Heart sounds: No murmur heard.    No friction rub. No gallop.  Pulmonary:     Effort: Pulmonary effort is normal. No respiratory distress.     Breath sounds: Normal breath sounds. No stridor. No rales.  Abdominal:     General: There is no distension.     Palpations: Abdomen is soft.     Tenderness: There is no abdominal tenderness. There is no right CVA tenderness, left CVA tenderness, guarding or rebound.  Musculoskeletal:        General: No tenderness.     Cervical back: Normal range of motion and neck supple.     Right lower leg: No swelling.     Left lower leg: No swelling.  Skin:    General: Skin is warm and dry.     Findings: No erythema or rash.     Comments: vitiligo  Neurological:     Mental Status: She is alert and oriented to person, place, and time.     ED Results and Treatments Labs (all labs ordered are listed, but only abnormal results are displayed) Labs Reviewed  BASIC METABOLIC PANEL - Abnormal; Notable for the following components:      Result Value   Glucose, Bld 147 (*)    All other components within normal limits  CBC - Abnormal; Notable for the following components:   MCV 79.6 (*)    All other components within normal limits  URINALYSIS, ROUTINE W REFLEX MICROSCOPIC - Abnormal; Notable for the following components:   APPearance HAZY (*)    Protein, ur TRACE (*)    All other components within normal limits  LIPASE, BLOOD - Abnormal; Notable for the following components:   Lipase 52 (*)    All other components within normal limits  CBG MONITORING, ED - Abnormal; Notable for the following components:   Glucose-Capillary 160 (*)    All other components within normal limits  PREGNANCY, URINE  HEPATIC FUNCTION PANEL  TROPONIN I (HIGH SENSITIVITY)  TROPONIN I (HIGH SENSITIVITY)  EKG  EKG Interpretation  Date/Time:  Tuesday April 15 2022 04:03:54 EDT Ventricular Rate:  81 PR Interval:  134 QRS Duration: 66 QT Interval:  344 QTC Calculation: 399 R Axis:   72 Text Interpretation: Normal sinus rhythm Normal ECG No previous ECGs available Confirmed by Addison Lank [phone]) on 04/15/2022 4:14:21 AM       Radiology CT Angio Chest/Abd/Pel for Dissection W and/or Wo Contrast  Result Date: 04/15/2022 CLINICAL DATA:  Rule out acute aortic syndrome. Chest pain. Shortness of breath and generalized weakness. EXAM: CT ANGIOGRAPHY CHEST, ABDOMEN AND PELVIS TECHNIQUE: Non-contrast CT of the chest was initially obtained. Multidetector CT imaging through the chest, abdomen and pelvis was performed using the standard protocol during bolus administration of intravenous contrast. Multiplanar reconstructed images and MIPs were obtained and reviewed to evaluate the vascular anatomy. RADIATION DOSE REDUCTION: This exam was performed according to the departmental dose-optimization program which includes automated exposure control, adjustment of the mA and/or kV according to patient size and/or use of iterative reconstruction technique. CONTRAST:  129m OMNIPAQUE IOHEXOL 350 MG/ML SOLN COMPARISON:  None Available. FINDINGS: CTA CHEST FINDINGS Cardiovascular: Preferential opacification of the thoracic aorta. No evidence of thoracic aortic aneurysm or dissection. Normal heart size. No pericardial effusion. Mediastinum/Nodes: No enlarged mediastinal, hilar, or axillary lymph nodes. Thyroid gland, trachea, and esophagus demonstrate no significant findings. Lungs/Pleura: No pleural effusion. No airspace consolidation, atelectasis, or pneumothorax. 3 mm subpleural nodule is identified within the periphery of the right lower lobe, image 56/7. Musculoskeletal: No chest wall abnormality. No acute or significant  osseous findings. Review of the MIP images confirms the above findings. CTA ABDOMEN AND PELVIS FINDINGS VASCULAR Aorta: Normal caliber aorta without aneurysm, dissection, vasculitis or significant stenosis. Celiac: Patent without evidence of aneurysm, dissection, vasculitis or significant stenosis. SMA: Patent without evidence of aneurysm, dissection, vasculitis or significant stenosis. Renals: Both renal arteries are patent without evidence of aneurysm, dissection, vasculitis, fibromuscular dysplasia or significant stenosis. IMA: Patent without evidence of aneurysm, dissection, vasculitis or significant stenosis. Inflow: Patent without evidence of aneurysm, dissection, vasculitis or significant stenosis. Veins: No obvious venous abnormality within the limitations of this arterial phase study. Review of the MIP images confirms the above findings. NON-VASCULAR Hepatobiliary: There is no suspicious liver lesion identified. Gallbladder appears normal. No bile duct dilatation. Pancreas: Unremarkable. No pancreatic ductal dilatation or surrounding inflammatory changes. Spleen: Normal in size without focal abnormality. Adrenals/Urinary Tract: The adrenal glands appear normal. No nephrolithiasis, hydronephrosis or mass identified. Urinary bladder is unremarkable. Stomach/Bowel: Stomach appears normal. The appendix is visualized and is within normal limits. Evaluation of bowel pathology is diminished due to lack of enteric contrast material. No signs of bowel wall thickening, inflammation, or distension. Lymphatic: No abdominopelvic adenopathy. Reproductive: Uterus and bilateral adnexa are unremarkable. Other: There is a left inguinal hernia containing fat only. No signs of pneumoperitoneum. No free fluid or fluid collections. Musculoskeletal: No acute or significant osseous findings. Review of the MIP images confirms the above findings. IMPRESSION: 1. No evidence for aortic aneurysm or dissection. 2. Left inguinal hernia  containing fat only. 3. 3 mm right solid pulmonary nodule. No routine follow-up imaging is recommended per Fleischner Society Guidelines. These guidelines do not apply to immunocompromised patients and patients with cancer. Follow up in patients with significant comorbidities as clinically warranted. For lung cancer screening, adhere to Lung-RADS guidelines. Reference: Radiology. 2017; 284(1):228-43. Electronically Signed   By: TKerby MoorsM.D.   On: 04/15/2022 07:18   DG Chest Portable 1 View  Result Date: 04/15/2022 CLINICAL DATA:  Chest pain EXAM: PORTABLE CHEST 1 VIEW COMPARISON:  None Available. FINDINGS: Heart size and mediastinal contours appear normal. The lungs are hypoinflated. No pleural effusion, interstitial edema or airspace disease. IMPRESSION: Low lung volumes. Electronically Signed   By: Kerby Moors M.D.   On: 04/15/2022 05:16    Pertinent labs & imaging results that were available during my care of the patient were reviewed by me and considered in my medical decision making (see MDM for details).  Medications Ordered in ED Medications  sodium chloride 0.9 % bolus 1,000 mL (0 mLs Intravenous Stopped 04/15/22 0510)    Followed by  sodium chloride 0.9 % bolus 1,000 mL (0 mLs Intravenous Stopped 04/15/22 0617)    Followed by  0.9 %  sodium chloride infusion (1,000 mLs Intravenous New Bag/Given 04/15/22 0633)  predniSONE (DELTASONE) tablet 30 mg (has no administration in time range)  iohexol (OMNIPAQUE) 350 MG/ML injection 100 mL (100 mLs Intravenous Contrast Given 04/15/22 0617)  alum & mag hydroxide-simeth (MAALOX/MYLANTA) 200-200-20 MG/5ML suspension 30 mL (30 mLs Oral Given 04/15/22 0749)    And  lidocaine (XYLOCAINE) 2 % viscous mouth solution 15 mL (15 mLs Oral Given 04/15/22 0749)  sodium chloride 0.9 % bolus 1,000 mL (1,000 mLs Intravenous New Bag/Given 04/15/22 0752)                                                                                                                                      Procedures .Critical Care  Performed by: Fatima Blank, MD Authorized by: Fatima Blank, MD   Critical care provider statement:    Critical care time (minutes):  45   Critical care time was exclusive of:  Separately billable procedures and treating other patients   Critical care was necessary to treat or prevent imminent or life-threatening deterioration of the following conditions: hypotension.   Critical care was time spent personally by me on the following activities:  Development of treatment plan with patient or surrogate, discussions with consultants, evaluation of patient's response to treatment, examination of patient, obtaining history from patient or surrogate, review of old charts, re-evaluation of patient's condition, pulse oximetry, ordering and review of radiographic studies, ordering and review of laboratory studies and ordering and performing treatments and interventions Ultrasound ED Echo  Date/Time: 04/15/2022 8:15 AM  Performed by: Fatima Blank, MD Authorized by: Fatima Blank, MD   Procedure details:    Indications: hypotension     Views: subxiphoid and IVC view     Images: archived     Limitations:  Body habitus Findings:    Pericardium: no pericardial effusion     IVC: collapsed   Impression:    Impression: probable low CVP     (including critical care time)  Medical Decision Making / ED Course    Complexity of Problem:  Patient's presenting problem/concern, DDX, and MDM listed below:  Chest pain Patient found to be hypotensive in triage with systolics in the 24Q. Presentation is atypical for ACS.  However will need to rule it out.  She has a heart score less than 3 and appropriate for delta Trop. We will assess for aortic dissection and pulmonary embolism. Patient denies any emesis concerning for esophageal perforation.  However given patient's steroid use, gastritis/esophagitis with likely esophageal  spasms or possibility. We will also assess for any infectious source.  Hospitalization Considered:  yes  Initial Intervention:  IVF    Complexity of Data:   Cardiac Monitoring: The patient was maintained on a cardiac monitor.   I personally viewed and interpreted the cardiac monitored which showed an underlying rhythm of normal sinus rhythm EKG without acute ischemic changes or evidence of pericarditis  Laboratory Tests ordered listed below with my independent interpretation: CBC without leukocytosis or anemia Metabolic panel without significant electrolyte derangement or renal sufficiency No evidence of biliary obstruction or pancreatitis UA without evidence of infection Serial troponins negative x2   Imaging Studies ordered listed below with my independent interpretation: Chest x-ray without evidence of pneumonia, pneumothorax, pulmonary edema or pleural effusions. CTA without evidence of aortic dissection, PE, pneumonia, pulmonary edema, or pleural effusions.     ED Course:    Assessment, Add'l Intervention, and Reassessment: Chest pain Work-up thus far has been reassuring. She has been ruled out for ACS, dissection, PE, infectious process, pneumothorax. Given her history I believe her symptoms are likely related to gastritis/esophagitis with esophageal spasms She was provided with GI cocktail We will recommend she start on PPI.  Hypotension Resolved with IV fluids Likely vasovagal due to severity of pain, with some GI losses from diarrhea vs orthostatsis. Orthostatics obtained after 2L IVF + POCUS with collapsing IVC Given add'l 1L. Plan to reassess. Patient care turned over to oncoming provider. Patient case and results discussed in detail; please see their note for further ED managment.   Final Clinical Impression(s) / ED Diagnoses Final diagnoses:  Chest pain in adult  Diarrhea, unspecified type       This chart was dictated using voice recognition  software.  Despite best efforts to proofread,  errors can occur which can change the documentation meaning.      Fatima Blank, MD 04/15/22 [id number]

## 2022-04-15 NOTE — ED Provider Notes (Addendum)
51 yo female on prednisone presented with right sided cp radiating to back with loose stools.  Nausea no voimiting.  EKG normal, first trop normal.  No cardiac risk factors except obesity.  SBP in 70s on arrival. CTA pending. Physical Exam  BP 122/78   Pulse (!) 107   Temp 98.1 F (36.7 C)   Resp 15   Ht 1.524 m (5')   Wt 81.6 kg   SpO2 97%   BMI 35.15 kg/m   Physical Exam  Procedures  Procedures  ED Course / MDM   Clinical Course as of 04/15/22 1035  Tue Apr 15, 2022  1030 Mildly elevated lipase of 52 [DR]    Clinical Course User Index [DR] Pattricia Boss, MD   Medical Decision Making Amount and/or Complexity of Data Reviewed Labs: ordered. Radiology: ordered.  Risk OTC drugs. Prescription drug management.    Patient was hypotensive and received 200 cc with bp improving.  Suspect vagal vs and volume depletion with diarrhea.  CTA pending If negative for pe or dissection, probable d/c 9:25 AM Dr. Leonette Monarch checked ultrasound before he left.  Felt the patient was volume depleted.  She has received additional IV fluid.  She is resting comfortably and has tolerated p.o.  She is mildly tachycardic.  Will recheck orthostatics blood pressure from lying 122/84 standing 115/72, RN reports mild increase in heart rate Patient is taking p.o. well.  We have discussed that she needs to continue taking p.o. well as an outpatient.  Discussed oral hydration with patient and her husband.  We have discussed return precautions and need for follow-up and she voiced understanding.     Pattricia Boss, MD 04/15/22 1036    Pattricia Boss, MD 04/15/22 1043

## 2022-04-15 NOTE — ED Notes (Signed)
Lab to result haptic function and lipase from previously collected labs.

## 2022-04-15 NOTE — ED Notes (Signed)
XR at bedside

## 2022-04-15 NOTE — ED Notes (Signed)
Orthostatic vital signs: Sitting: BP 105/67 (map 78) HR 101 Standing: 74/61 (map 67) HR 102 Lying: BP 101/72 (map 82) HR 103

## 2022-04-15 NOTE — ED Notes (Signed)
Pt weakness worsened in triage. Pt initial BP 74/63. Has x2 episodes of urinary incontinence. IV inserted. IVF NS bolus initiated. Pt roomed to next available room. EDP currently at bedside.

## 2022-04-15 NOTE — ED Notes (Signed)
   04/15/22 1005 04/15/22 1006 04/15/22 1007  Vitals  BP 122/84 116/65 115/72  BP Location Right Arm Right Arm Right Arm  BP Method Automatic Automatic Automatic  Patient Position (if appropriate) Lying Sitting Standing   Orthostatic VS recorded above, pt tolerated well. Denies dizziness upon standing.

## 2022-04-15 NOTE — Discharge Instructions (Addendum)
During the workup we noted incidental findings on your imaging that would require you to follow-up with your regular doctor for further evaluation/management:  3 mm subpleural nodule is identified within the periphery of the right lower lobe.

## 2022-04-15 NOTE — ED Notes (Signed)
Patient transported to CT 

## 2023-01-15 ENCOUNTER — Emergency Department (HOSPITAL_BASED_OUTPATIENT_CLINIC_OR_DEPARTMENT_OTHER): Payer: BC Managed Care – PPO

## 2023-01-15 ENCOUNTER — Emergency Department (HOSPITAL_BASED_OUTPATIENT_CLINIC_OR_DEPARTMENT_OTHER): Payer: BC Managed Care – PPO | Admitting: Radiology

## 2023-01-15 ENCOUNTER — Other Ambulatory Visit: Payer: Self-pay

## 2023-01-15 ENCOUNTER — Emergency Department (HOSPITAL_BASED_OUTPATIENT_CLINIC_OR_DEPARTMENT_OTHER)
Admission: EM | Admit: 2023-01-15 | Discharge: 2023-01-16 | Disposition: A | Discharge status: Home / Self Care | Payer: BC Managed Care – PPO | Attending: Emergency Medicine | Admitting: Emergency Medicine

## 2023-01-15 ENCOUNTER — Encounter (HOSPITAL_BASED_OUTPATIENT_CLINIC_OR_DEPARTMENT_OTHER): Payer: Self-pay

## 2023-01-15 DIAGNOSIS — E119 Type 2 diabetes mellitus without complications: Secondary | ICD-10-CM | POA: Diagnosis not present

## 2023-01-15 DIAGNOSIS — H538 Other visual disturbances: Secondary | ICD-10-CM | POA: Insufficient documentation

## 2023-01-15 DIAGNOSIS — R42 Dizziness and giddiness: Secondary | ICD-10-CM

## 2023-01-15 DIAGNOSIS — H539 Unspecified visual disturbance: Secondary | ICD-10-CM

## 2023-01-15 LAB — CBC
HCT: 35.9 % — ABNORMAL LOW (ref 36.0–46.0)
Hemoglobin: 12.4 g/dL (ref 12.0–15.0)
MCH: 27.4 pg (ref 26.0–34.0)
MCHC: 34.5 g/dL (ref 30.0–36.0)
MCV: 79.2 fL — ABNORMAL LOW (ref 80.0–100.0)
Platelets: 398 10*3/uL (ref 150–400)
RBC: 4.53 MIL/uL (ref 3.87–5.11)
RDW: 13.4 % (ref 11.5–15.5)
WBC: 8.4 10*3/uL (ref 4.0–10.5)
nRBC: 0 % (ref 0.0–0.2)

## 2023-01-15 LAB — BASIC METABOLIC PANEL
Anion gap: 10 (ref 5–15)
BUN: 9 mg/dL (ref 6–20)
CO2: 26 mmol/L (ref 22–32)
Calcium: 9.8 mg/dL (ref 8.9–10.3)
Chloride: 101 mmol/L (ref 98–111)
Creatinine, Ser: 0.73 mg/dL (ref 0.44–1.00)
GFR, Estimated: >60 mL/min (ref >60)
Glucose, Bld: 114 mg/dL — ABNORMAL HIGH (ref 70–99)
Potassium: 3.6 mmol/L (ref 3.5–5.1)
Sodium: 137 mmol/L (ref 135–145)

## 2023-01-15 MED ORDER — SODIUM CHLORIDE 0.9 % IV BOLUS (SEPSIS): 1000.0000 mL | Freq: Once | INTRAVENOUS | Status: DC

## 2023-01-15 NOTE — ED Provider Notes (Addendum)
[facility] Provider Note   CSN: [id number] Arrival date & time: 01/15/23  2149     History  Chief Complaint  Patient presents with   Dizziness    Natasha Liu is a 52 y.o. female.   Dizziness    Patient has a history of MRSA vitiligo irregular heartbeat, diabetes, inguinal hernia.  Patient presents ED with complaints of intermittent dizziness ongoing for few weeks.  She is also had intermittent tremors to her upper extremities more so on the right side for couple weeks.  Patient was also noticed a change in her vision in her left eye.  Patient states her vision is not blurred but she feels like the color perception is different in that eye compared to her left.  She is not having any trouble seeing.  She is not having any diplopia.  Patient decided that she finally needed to get this checked out so she came in for evaluation.  Patient also notes she has had a chronic cough for several months after previously being diagnosed with pneumonia.  She does have an inguinal hernia and the coughing sometimes irritates that.  Home Medications Prior to Admission medications   Medication Sig Start Date End Date Taking? Authorizing Provider  acetaminophen (TYLENOL) 325 MG tablet Take 650-975 mg by mouth every 6 (six) hours as needed for mild pain, moderate pain or headache.    [provider]  albuterol (PROVENTIL HFA;VENTOLIN HFA) 108 (90 Base) MCG/ACT inhaler Inhale 2 puffs into the lungs every 4 (four) hours as needed for wheezing or shortness of breath. 04/09/16   Lysbeth Penner, FNP  ascorbic acid (VITAMIN C) 500 MG tablet Take 1,000 mg by mouth daily as needed (when pt feels like she is catching a cold).     [provider]  Biotin 5000 MCG CAPS Take 5,000 mcg by mouth every other day.     [provider]  fexofenadine (ALLEGRA) 180 MG tablet Take 180 mg by mouth daily as needed for allergies.     [provider]  ibuprofen (ADVIL,MOTRIN) 800 MG tablet Take 800 mg by mouth every 8 (eight) hours as needed.    [provider]  lisdexamfetamine (VYVANSE) 30 MG capsule Take 30 mg by mouth daily as needed (for ADHD).    [provider]  methocarbamol (ROBAXIN) 500 MG tablet Take 1 tablet (500 mg total) by mouth 2 (two) times daily. 08/07/18   Nuala Alpha A, PA-C  Multiple Vitamin (MULTIVITAMIN WITH MINERALS) TABS tablet Take 1 tablet by mouth daily.    [provider]  oxyCODONE-acetaminophen (PERCOCET/ROXICET) 5-325 MG tablet Take 1-2 tablets by mouth every 4 (four) hours as needed for severe pain (moderate to severe pain (when tolerating fluids)). 09/09/16   Servando Salina, MD  pantoprazole (PROTONIX) 20 MG tablet Take 1 tablet (20 mg total) by mouth daily. 04/15/22   Fatima Blank, MD      Allergies    Aspirin and Other    Review of Systems   Review of Systems  Neurological:  Positive for dizziness.    Physical Exam Updated Vital Signs BP (!) 135/92   Pulse (!) 102   Temp 99 F (37.2 C) (Oral)   Resp (!) 22   Ht 1.524 m (5')   Wt 75.8 kg   LMP 07/31/2016 (Approximate)   SpO2 98%   BMI 32.61 kg/m  Physical Exam Vitals and nursing note reviewed.  Constitutional:  General: She is not in acute distress.    Appearance: She is well-developed.  HENT:     Head: Normocephalic and atraumatic.     Right Ear: External ear normal.     Left Ear: External ear normal.  Eyes:     General: No visual field deficit or scleral icterus.       Right eye: No discharge.        Left eye: No discharge.     Conjunctiva/sclera: Conjunctivae normal.  Neck:     Trachea: No tracheal deviation.  Cardiovascular:     Rate and Rhythm: Normal rate and regular rhythm.  Pulmonary:     Effort: Pulmonary effort is normal. No respiratory distress.     Breath sounds: Normal breath sounds. No stridor. No wheezing or rales.  Abdominal:     General: Bowel sounds are  normal. There is no distension.     Palpations: Abdomen is soft.     Tenderness: There is no abdominal tenderness. There is no guarding or rebound.  Musculoskeletal:        General: No tenderness.     Cervical back: Neck supple.  Skin:    General: Skin is warm and dry.     Findings: No rash.  Neurological:     Mental Status: She is alert and oriented to person, place, and time.     Cranial Nerves: No cranial nerve deficit, dysarthria or facial asymmetry.     Sensory: No sensory deficit.     Motor: No abnormal muscle tone, seizure activity or pronator drift.     Coordination: Coordination normal.     Comments:  able to hold both legs off bed for 5 seconds, sensation intact in all extremities,  no left or right sided neglect, normal finger-nose exam bilaterally, no nystagmus noted   Psychiatric:        Mood and Affect: Mood normal.     ED Results / Procedures / Treatments   Labs (all labs ordered are listed, but only abnormal results are displayed) Labs Reviewed  BASIC METABOLIC PANEL - Abnormal; Notable for the following components:      Result Value   Glucose, Bld 114 (*)    All other components within normal limits  CBC - Abnormal; Notable for the following components:   HCT 35.9 (*)    MCV 79.2 (*)    All other components within normal limits    EKG EKG Interpretation  Date/Time:  Thursday January 15 2023 22:02:43 EDT Ventricular Rate:  115 PR Interval:  142 QRS Duration: 68 QT Interval:  332 QTC Calculation: 459 R Axis:   60 Text Interpretation: Sinus tachycardia Otherwise normal ECG When compared with ECG of 15-Apr-2022 04:03, Since last tracing rate faster Confirmed by Dorie Rank [phone]) on 01/15/2023 10:04:53 PM  Radiology DG Chest 2 View  Result Date: 01/15/2023 CLINICAL DATA:  Cough EXAM: CHEST - 2 VIEW COMPARISON:  04/15/2022 FINDINGS: The heart size and mediastinal contours are within normal limits. Both lungs are clear. The visualized skeletal structures are  unremarkable. IMPRESSION: No active cardiopulmonary disease. Electronically Signed   By: Donavan Foil M.D.   On: 01/15/2023 23:26   CT Head Wo Contrast  Result Date: 01/15/2023 CLINICAL DATA:  Intermittent dizziness for the last 2-3 days with tremors in the bilateral upper extremities for 1-2 weeks. Vision trouble on the left eye. Symptoms worsening today. Some nausea. EXAM: CT HEAD WITHOUT CONTRAST TECHNIQUE: Contiguous axial images were obtained from the base of the  skull through the vertex without intravenous contrast. RADIATION DOSE REDUCTION: This exam was performed according to the departmental dose-optimization program which includes automated exposure control, adjustment of the mA and/or kV according to patient size and/or use of iterative reconstruction technique. COMPARISON:  None Available. FINDINGS: Brain: No intracranial hemorrhage, mass effect, or evidence of acute infarct. No hydrocephalus. No extra-axial fluid collection. Vascular: No hyperdense vessel or unexpected calcification. Skull: No fracture or focal lesion. Sinuses/Orbits: No acute finding. Paranasal sinuses and mastoid air cells are well aerated. Other: None. IMPRESSION: No acute intracranial process. Electronically Signed   By: Placido Sou M.D.   On: 01/15/2023 23:20    Procedures Procedures    Medications Ordered in ED Medications  sodium chloride 0.9 % bolus 1,000 mL (has no administration in time range)    ED Course/ Medical Decision Making/ A&P Clinical Course as of 01/15/23 2347  Thu Jan 15, 2023  2328 CT scan without acute abnormalities [JK]  2328 CBC normal [JK]    Clinical Course User Index [JK] Dorie Rank, MD                             Medical Decision Making Differential diagnosis includes but not limited to, stroke, brain tumor, electrolyte disturbance, multiple sclerosis, optic neuritis.  Amount and/or Complexity of Data Reviewed Labs: ordered. Radiology: ordered.   Will plan on labs, CT  scan.  Pt without focal deficits noted on exam.  Sx ongoing for several weeks.  Could consider MRI but that is not available at this facility.  Sx ongoing for several weeks.  No acute abnormalities noted on CT scan.  No definite focal deficits noted on exam.  Discussed with patient possibilities of something like optic neuritis or multiple sclerosis.  Patient would benefit from neurology evaluation and possible MRI.  MRI is not available at this facility.  Discussed option of transferring to [facility] to have an MRI this evening versus outpatient referral to neurology.  Patient would prefer the latter.  I do think this is reasonable considering the chronicity of her symptoms.  Do recommend immediate return to the hospital at [facility] if she has any worsening symptoms.        Final Clinical Impression(s) / ED Diagnoses Final diagnoses:  Dizziness  Visual disturbance    Rx / DC Orders ED Discharge Orders          Ordered    Ambulatory referral to Neurology       Comments: An appointment is requested in approximately: 1 week   01/15/23 2344              Dorie Rank, MD 01/15/23 2258    Dorie Rank, MD 01/15/23 2347

## 2023-01-15 NOTE — Discharge Instructions (Signed)
Follow-up with an eye doctor for further evaluation.  I did place a referral to her neurologist.  They should call you.  Return to [facility] for further evaluation if you have any worsening symptoms as we discussed

## 2023-01-15 NOTE — ED Triage Notes (Signed)
Patient here POV from Home.  Endorses Intermittent Dizziness for approximately 2-3 Days. Notes Tremors to Bilateral Upper Extremities for about 1-2 Weeks (Mostly Right). Also notes Trouble with her Vision in which her Left Eye will be blurry.   Seeks Evaluation for Symptoms as they became worse today. Some nausea. No emesis. No Diarrhea.   NAD Noted during Triage. A&Ox4. Gcs 15. Ambulatory.

## 2023-01-16 NOTE — ED Notes (Signed)
Pt verbalized understanding of d/c instructions, meds, and followup care. Denies questions. VSS, no distress noted. Steady gait to exit with all belongings.  ?

## 2023-01-19 NOTE — Progress Notes (Unsigned)
[facility]  PATIENT: Natasha Liu DOB: 06-05-1971  REFERRING DOCTOR OR PCP: Linwood Dibbles, MD; Dr. Allena Katz (Ophth); Orvilla Cornwall (PCP) SOURCE: Patient, note from ED, imaging and lab reports, images personally reviewed.  _________________________________   HISTORICAL  CHIEF COMPLAINT:  Chief Complaint  Patient presents with   New Patient (Initial Visit)    RM 10. Paper referral for  Dizzines, visual disturbance.  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 Tremor in R hand then started. Left leg has shocking sensations. Imbalance started this past Thursday.    HISTORY OF PRESENT ILLNESS:  I had the pleasure to see your patient, Natasha Liu, at [facility] for neurologic consultation regarding her vertigo and visual changes.  She is a 52 year old woman who presented to the emergency room 01/15/2023 with intermittent dizziness over the previous few weeks.  Additionally, she had noted intermittent tremors in the arms, right greater than left.  She also reported some visual changes in the left eye with reduced colors compared to the right.    She had the onset of OS visual changes about one month ago and also noted colors were washed out. She noted mild pain with eye movements.     A week ago she had vertigo and right hand tremor prompting the ED visit.   Compared to last week she is mostly unchanged.   She does note mild fluctuation but vision stays affected  She has not had other neurologic symptoms in the past.  In the emergency room, she had a CT scan which was normal.  Not available and she opted to continue evaluation as an patient transferred.  She also has vitiligo.  She has had coughing since December 2023  She saw Dr. Allena Katz at [facility] and was found to have a normal retina.   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    He noted some disc blurring inferiorly in both eyes    Imaging: CT scan of the head 01/15/2023 was  normal.  The internal auditory canals appear normal.  CXR 01/15/2023 was normal  CTA chest 04/15/2022 showed a small 3 mm pulmonary nodule.       REVIEW OF SYSTEMS: Constitutional: No fevers, chills, sweats, or change in appetite Eyes: No visual changes, double vision, eye pain Ear, nose and throat: No hearing loss, ear pain, nasal congestion, sore throat Cardiovascular: No chest pain, palpitations Respiratory:  No shortness of breath at rest or with exertion.   No wheezes GastrointestinaI: No nausea, vomiting, diarrhea, abdominal pain, fecal incontinence Genitourinary:  No dysuria, urinary retention or frequency.  No nocturia. Musculoskeletal:  No neck pain, back pain Integumentary: No rash, pruritus, skin lesions Neurological: as above Psychiatric: No depression at this time.  No anxiety Endocrine: No palpitations, diaphoresis, change in appetite, change in weigh or increased thirst Hematologic/Lymphatic:  No anemia, purpura, petechiae. Allergic/Immunologic: No itchy/runny eyes, nasal congestion, recent allergic reactions, rashes  ALLERGIES: Allergies  Allergen Reactions   Aspirin Hives   Other Hives    Cold temperatures    HOME MEDICATIONS:  Current Outpatient Medications:    acetaminophen (TYLENOL) 325 MG tablet, Take 650-975 mg by mouth every 6 (six) hours as needed for mild pain, moderate pain or headache., Disp: , Rfl:    albuterol (PROVENTIL HFA;VENTOLIN HFA) 108 (90 Base) MCG/ACT inhaler, Inhale 2 puffs into the lungs every 4 (four) hours as needed for wheezing or shortness of breath., Disp: 1 Inhaler, Rfl: 0  ibuprofen (ADVIL,MOTRIN) 800 MG tablet, Take 800 mg by mouth every 8 (eight) hours as needed., Disp: , Rfl:    Levocetirizine Dihydrochloride (XYZAL ALLERGY 24HR PO), Take by mouth., Disp: , Rfl:    lisdexamfetamine (VYVANSE) 30 MG capsule, Take 30 mg by mouth daily as needed (for ADHD)., Disp: , Rfl:   PAST MEDICAL HISTORY: Past Medical History:  Diagnosis Date    Adult ADHD    Bronchitis    lasted 8-10 weeks   Constipation    Gestational diabetes    High cholesterol    Inguinal hernia    Irregular heart beat    MRSA infection    boils   Toxemia in pregnancy    Vitiligo    under breast and underwear areas    PAST SURGICAL HISTORY: Past Surgical History:  Procedure Laterality Date   CESAREAN SECTION     x2   MOUTH SURGERY     Tooth removed   ROBOTIC ASSISTED TOTAL HYSTERECTOMY WITH SALPINGECTOMY Bilateral 09/08/2016   Procedure: ROBOTIC ASSISTED TOTAL HYSTERECTOMY WITH SALPINGECTOMY;  Surgeon: Maxie BetterSheronette Cousins, MD;  Location: [facility];  Service: Gynecology;  Laterality: Bilateral;   WISDOM TOOTH EXTRACTION     x4    FAMILY HISTORY: Family History  Problem Relation Age of Onset   Dementia Mother    Seizures Mother    High Cholesterol Mother    Hypertension Father     SOCIAL HISTORY: Social History   Socioeconomic History   Marital status: Married    Spouse name: Not on file   Number of children: Not on file   Years of education: Not on file   Highest education level: Not on file  Occupational History   Not on file  Tobacco Use   Smoking status: Never   Smokeless tobacco: Never  Substance and Sexual Activity   Alcohol use: Yes    Comment: ocassionally   Drug use: Not Currently    Comment: CBD gummies   Sexual activity: Yes    Birth control/protection: Surgical  Other Topics Concern   Not on file  Social History Narrative   Right handed   Caffeine use: rare   Social Determinants of Health   Financial Resource Strain: Not on file  Food Insecurity: Not on file  Transportation Needs: Not on file  Physical Activity: Not on file  Stress: Not on file  Social Connections: Not on file  Intimate Partner Violence: Not on file       PHYSICAL EXAM  Vitals:   01/21/23 1436  BP: 117/86  Pulse: (!) 108  Weight: 165 lb 6.4 oz (75 kg)  Height: 5' (1.524 m)    Body mass index is 32.3 kg/m.   OD 20/20 OS  20/30-2   General: The patient is well-developed and well-nourished and in no acute distress  HEENT:  Head is Pala/AT.  Sclera are anicteric.  Funduscopic exam shows normal optic discs and retinal vessels on the right bu blurred optic disc inferior margin OS.  Neck: No carotid bruits are noted.  The neck is nontender.Mild reduced ROm  Cardiovascular: The heart has a regular rate and rhythm with a normal S1 and S2. There were no murmurs, gallops or rubs.    Skin: Extremities are without rash or  edema.  Musculoskeletal:  Back is nontender  Neurologic Exam  Mental status: The patient is alert and oriented x 3 at the time of the examination. The patient has apparent normal recent and remote memory, with an apparently  normal attention span and concentration ability.   Speech is normal.  Cranial nerves: Extraocular movements are full. Pupils are equal, round, and reactive to light and accomodation.  Color vision is reduced OS.    Facial symmetry is present. There is good facial sensation to soft touch bilaterally.Facial strength is normal.  Trapezius and sternocleidomastoid strength is normal. No dysarthria is noted.  The tongue is midline, and the patient has symmetric elevation of the soft palate. No obvious hearing deficits are noted but Weber lateralized to left.  Motor:  Muscle bulk is normal.   Tone is normal. Strength is  5 / 5 in all 4 extremities.   Sensory: Sensory testing is intact to pinprick, soft touch and vibration sensation in all 4 extremities.  Coordination: Cerebellar testing reveals good finger-nose-finger and heel-to-shin bilaterally.  Gait and station: Station is normal.   Gait is normal. Tandem gait is wide. . Romberg is negative.   Reflexes: Deep tendon reflexes are symmetric and normal n arms, 3 at knees with mild spread and normal in ankles.   .   Plantar responses are flexor.    DIAGNOSTIC DATA (LABS, IMAGING, TESTING) - I reviewed patient records, labs, notes,  testing and imaging myself where available.  Lab Results  Component Value Date   WBC 8.4 01/15/2023   HGB 12.4 01/15/2023   HCT 35.9 (L) 01/15/2023   MCV 79.2 (L) 01/15/2023   PLT 398 01/15/2023      Component Value Date/Time   NA 137 01/15/2023 2217   K 3.6 01/15/2023 2217   CL 101 01/15/2023 2217   CO2 26 01/15/2023 2217   GLUCOSE 114 (H) 01/15/2023 2217   BUN 9 01/15/2023 2217   CREATININE 0.73 01/15/2023 2217   CALCIUM 9.8 01/15/2023 2217   PROT 6.8 04/15/2022 0414   ALBUMIN 3.9 04/15/2022 0414   AST 15 04/15/2022 0414   ALT 27 04/15/2022 0414   ALKPHOS 47 04/15/2022 0414   BILITOT 0.8 04/15/2022 0414   GFRNONAA >60 01/15/2023 2217   GFRAA >60 09/09/2016 0504       ASSESSMENT AND PLAN  Optic neuropathy, left - Plan: Neuromyelitis optica autoab, IgG, Anti-MOG, Serum, Sedimentation rate, C-reactive protein, Angiotensin converting enzyme, MR BRAIN W WO CONTRAST, MR ORBITS W WO CONTRAST, Thyroid Panel With TSH, ANA+ENA+DNA/DS+Scl 70+SjoSSA/B  Vertigo - Plan: MR BRAIN W WO CONTRAST, Thyroid Panel With TSH  Gait disturbance - Plan: MR BRAIN W WO CONTRAST  Vitiligo  Tremor   In summary, Ms. Natasha Liu is a 52 year old woman with left visual changes, vertigo and other symptoms.  The asymmetric color vision with mild visual loss is most consistent with some form of optic neuropathy.  To further evaluate we will check lab work for ESR, CRP, angiotensin-converting enzyme, ENA, anti-MOG, anti-NMO.  Additionally we will check MRI of the brain and cervical spine to assess for orbital processes, demyelination and other disorders.  Based on the results of the studies further evaluation or treatment may be necessary.  We will let her know the results and schedule additional testing or follow-up as needed.  She should call if there are new or worsening neurologic symptoms.  Thank you for asking me to see Ms. Klus.  Please let me know if I can be of further assistance with her or  other patients in the future.  Kyomi Hector A. Epimenio Foot, MD, [facility] 01/21/2023, 4:18 PM Certified in Neurology, Clinical Neurophysiology, Sleep Medicine and Neuroimaging  [facility] [address] [place], [place]  [address] [phone] 

## 2023-01-21 ENCOUNTER — Encounter: Payer: Self-pay | Admitting: Neurology

## 2023-01-21 ENCOUNTER — Ambulatory Visit: Payer: BC Managed Care – PPO | Admitting: Neurology

## 2023-01-21 VITALS — BP 117/86 | HR 108 | Ht 60.0 in | Wt 165.4 lb

## 2023-01-21 DIAGNOSIS — L80 Vitiligo: Secondary | ICD-10-CM

## 2023-01-21 DIAGNOSIS — R269 Unspecified abnormalities of gait and mobility: Secondary | ICD-10-CM

## 2023-01-21 DIAGNOSIS — H469 Unspecified optic neuritis: Secondary | ICD-10-CM

## 2023-01-21 DIAGNOSIS — R42 Dizziness and giddiness: Secondary | ICD-10-CM | POA: Diagnosis not present

## 2023-01-21 DIAGNOSIS — R251 Tremor, unspecified: Secondary | ICD-10-CM

## 2023-01-22 ENCOUNTER — Telehealth: Payer: Self-pay

## 2023-01-22 LAB — ANTI-MOG, SERUM: MOG Antibody, Cell-based IFA: NEGATIVE

## 2023-01-22 NOTE — Telephone Encounter (Signed)
[place] Numbers: [id number] exp. 4/11024-02/20/23 sent to [facility] [phone]

## 2023-01-22 NOTE — Telephone Encounter (Signed)
FYI:  Patient called in to check on the status of her MRI order placed yesterday by Dr Epimenio Foot. She is asking if she can be scheduled today for that MRI as she wants to get it done urgently. I let her know we were most likely working on authorization for this through her insurance and it may take a few days to get this authorized. I did give her the phone number for the hospital to reach out in case she did want to get it scheduled through them instead of our office.

## 2023-01-23 LAB — THYROID PANEL WITH TSH
Free Thyroxine Index: 1.9 (ref 1.2–4.9)
T3 Uptake Ratio: 29 % (ref 24–39)
T4, Total: 6.7 ug/dL (ref 4.5–12.0)
TSH: 2.12 u[IU]/mL (ref 0.450–4.500)

## 2023-01-23 LAB — ANA+ENA+DNA/DS+SCL 70+SJOSSA/B
ANA Titer 1: NEGATIVE
ENA RNP Ab: <0.2 AI (ref 0.0–0.9)
ENA SM Ab Ser-aCnc: <0.2 AI (ref 0.0–0.9)
ENA SSA (RO) Ab: <0.2 AI (ref 0.0–0.9)
ENA SSB (LA) Ab: <0.2 AI (ref 0.0–0.9)
Scleroderma (Scl-70) (ENA) Antibody, IgG: <0.2 AI (ref 0.0–0.9)
dsDNA Ab: <1 IU/mL (ref 0–9)

## 2023-01-23 LAB — C-REACTIVE PROTEIN: CRP: 2 mg/L (ref 0–10)

## 2023-01-23 LAB — ANGIOTENSIN CONVERTING ENZYME: Angio Convert Enzyme: 42 U/L (ref 14–82)

## 2023-01-23 LAB — SEDIMENTATION RATE: Sed Rate: 8 mm/hr (ref 0–40)

## 2023-01-23 LAB — NEUROMYELITIS OPTICA AUTOAB, IGG: NMO IgG Autoantibodies: <1.5 U/mL (ref 0.0–3.0)

## 2023-01-26 ENCOUNTER — Telehealth: Payer: Self-pay | Admitting: *Deleted

## 2023-01-26 NOTE — Telephone Encounter (Signed)
Called and spoke with pt about lab results per Dr. Bonnita Hollow note. Pt verbalized understanding.   She wanted to know status of getting scheduled for MRI's. I reviewed chart. Appears [facility] tried calling her 01/22/23. She states she has not missed calls. I advised her to call [facility] at [phone] to schedule. She will call us back if she has any trouble getting scheduled. She verbalized understanding.

## 2023-01-26 NOTE — Telephone Encounter (Signed)
-----   Message from Asa Lente, MD sent at 01/23/2023  5:23 PM EDT ----- Please let the patient know that the lab work is fine.

## 2023-01-31 ENCOUNTER — Ambulatory Visit (HOSPITAL_COMMUNITY)
Admission: RE | Admit: 2023-01-31 | Discharge: 2023-01-31 | Disposition: A | Discharge status: Home / Self Care | Payer: BC Managed Care – PPO | Source: Ambulatory Visit | Attending: Neurology | Admitting: Neurology

## 2023-01-31 DIAGNOSIS — R269 Unspecified abnormalities of gait and mobility: Secondary | ICD-10-CM | POA: Insufficient documentation

## 2023-01-31 DIAGNOSIS — R42 Dizziness and giddiness: Secondary | ICD-10-CM | POA: Insufficient documentation

## 2023-01-31 DIAGNOSIS — H469 Unspecified optic neuritis: Secondary | ICD-10-CM

## 2023-01-31 MED ORDER — GADOBUTROL 1 MMOL/ML IV SOLN
7.5000 mL | Freq: Once | INTRAVENOUS | Status: AC | PRN
  Administered 2023-01-31: 7.5 mL via INTRAVENOUS

## 2023-02-04 ENCOUNTER — Telehealth: Payer: Self-pay | Admitting: Neurology

## 2023-02-04 NOTE — Telephone Encounter (Signed)
Dr. Epimenio Foot- looks like pt completed MRI brain/orbits 01/31/23. Calling for results

## 2023-02-04 NOTE — Telephone Encounter (Signed)
Per Stanton Kidney, imaging report is in epic now

## 2023-02-04 NOTE — Telephone Encounter (Signed)
Pt stated she is following up on MRI results.

## 2023-02-04 NOTE — Telephone Encounter (Signed)
Natasha Liu- can you help with this?

## 2023-02-05 NOTE — Telephone Encounter (Signed)
Spoke with pt and let her know that Dr. Teresa Coombs looked at her scans and he said that she does not have any sign of  stroke, and no MS. Her Brain MRI is normal. Pt verbalized understanding.

## 2023-02-25 ENCOUNTER — Telehealth: Payer: Self-pay | Admitting: *Deleted

## 2023-02-25 NOTE — Telephone Encounter (Signed)
Pt called stating she never heard about her MRI of brain or orbits on 01/31/23. I do see that pt was informed about brain MRI on telephone encounter on 02/04/23.   Pt said she is still having dizziness (not vertigo) when standing up, pt said she feels like she will fall when getting up at times. Pt said her sight is better in left eye now, she reports her ear feel like she has stuffed cotton in her ear,( reports this comes and goes) coughing up yellowish/ greenish phlegm. I did suggest she check with PCP regarding cough and phlegm.   She asked if anything regarding dizziness should be addressed?   Please advise

## 2023-02-25 NOTE — Telephone Encounter (Deleted)
[id number]  

## 2023-02-26 NOTE — Telephone Encounter (Signed)
Patient informed with all the below   Please let her know that the MRI of the brain was normal for her age.  Additionally, the middle and the inner ears looked normal.  She might be having an upper respiratory infection and should contact primary care to see if she needs to do anything differently.  We are able to look at the sinuses on the MRI and they were normal.    The nerves connecting the eye and brain were normal

## 2023-06-11 ENCOUNTER — Encounter: Payer: Self-pay | Admitting: Neurology

## 2023-06-11 ENCOUNTER — Ambulatory Visit: Payer: BC Managed Care – PPO | Admitting: Neurology

## 2023-06-11 ENCOUNTER — Telehealth: Payer: Self-pay | Admitting: *Deleted

## 2023-06-11 VITALS — BP 128/89 | HR 105 | Ht 60.0 in | Wt 153.0 lb

## 2023-06-11 DIAGNOSIS — R29898 Other symptoms and signs involving the musculoskeletal system: Secondary | ICD-10-CM | POA: Diagnosis not present

## 2023-06-11 DIAGNOSIS — R251 Tremor, unspecified: Secondary | ICD-10-CM

## 2023-06-11 DIAGNOSIS — R200 Anesthesia of skin: Secondary | ICD-10-CM

## 2023-06-11 DIAGNOSIS — R159 Full incontinence of feces: Secondary | ICD-10-CM

## 2023-06-11 DIAGNOSIS — R269 Unspecified abnormalities of gait and mobility: Secondary | ICD-10-CM

## 2023-06-11 NOTE — Telephone Encounter (Signed)
   Faxed to [phone] confirmation received.

## 2023-06-11 NOTE — Addendum Note (Signed)
Addended by: Asa Lente on: 06/11/2023 09:19 AM   Modules accepted: Level of Service

## 2023-06-11 NOTE — Progress Notes (Addendum)
[facility]  PATIENT: Natasha Liu DOB: 25-Oct-1970  REFERRING DOCTOR OR PCP: Linwood Dibbles, MD; Dr. Allena Katz (Ophth); Orvilla Cornwall (PCP) SOURCE: Patient, note from ED, imaging and lab reports, images personally reviewed.  _________________________________   HISTORICAL  CHIEF COMPLAINT:  Chief Complaint  Patient presents with   Room 10    Pt is here with her Husband. Pt states that her gait issues started in July. Pt states that she has had 3 falls recently. Pt states that her right leg and ankle hurts. Pt states that she is having hearing loss. Pt states that her right leg shakes. Pt states that her right kneecap pops out. Pt states that she is wanting to know what is going on. Pt states that she is having memory issues. Pt states that her right hand gets numb when her arm spasms.     HISTORY OF PRESENT ILLNESS:  Natasha Liu is a 52 y.o. woman with vertigo and visual changes.  Update 06/11/2023 Since last visit, she has had MRI of the brain and orbits.  These are normal for age.  The MRI of the brain just shows a few scattered T2/FLAIR hyperintense foci in the subcortical or deep white matter consistent with age-appropriate minimal chronic microvascular ischemic change.  She also had lab work.  Anti-MOG and neuromyelitis optica IgG were both negative.  ESR, CRP, ACE and TSH were in normal range  She reports that her vision is doing better, closer to baseline.  Her dizziness is about the same - she reports being off balanced.   She has seen ENT and they felt she had hearing loss and will benefit from hearing aids.  She feels the hearing loss is recent.  She feels she hears but has trouble making out what people are saying.       Since the last vision her gait has progressively worsened.   She is using a walker now for balance more than strength though the right leg (and to lesser extent the arm) feel weaker.    She has right leg numbness.   She has urinary hesitancy.    She  has had bowel incontinence.   PT has noted DTRs were increased at initial evaluation and now decreased (I concur, see below) She has a tremor, improved with propranolol.   Has FH of tremor and most consistent with BET   History of dizziness and visual changes: She presented to the emergency room 01/15/2023 with intermittent dizziness over the previous few weeks.  Additionally, she had noted intermittent tremors in the arms, right greater than left.  She also reported some visual changes in the left eye with reduced colors compared to the right.    She had the onset of OS visual changes around that time and also noted colors were washed out. She noted mild pain with eye movements.     Also, she had vertigo and right hand tremor prompting the ED visit.   When I saw her 01/21/2023, she was unchanged.  She notes that the symptoms fluctuate for the most part though the visual symptoms were more persistent.  She has not had other neurologic symptoms in the past.   In the emergency room, she had a CT scan which was normal.  Not available and she opted to continue evaluation as an patient transferred.  She also has vitiligo.  She has had coughing since December 2023  She saw Dr. Allena Katz at [facility] and was found to have a  normal retina.   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    He noted some disc blurring inferiorly in both eyes    Imaging: CT scan of the head 01/15/2023 was normal.  The internal auditory canals appear normal.  CXR 01/15/2023 was normal  CTA chest 04/15/2022 showed a small 3 mm pulmonary nodule.       REVIEW OF SYSTEMS: Constitutional: No fevers, chills, sweats, or change in appetite Eyes: No visual changes, double vision, eye pain Ear, nose and throat: No hearing loss, ear pain, nasal congestion, sore throat Cardiovascular: No chest pain, palpitations Respiratory:  No shortness of breath at rest or with exertion.   No wheezes GastrointestinaI: No nausea,  vomiting, diarrhea, abdominal pain, fecal incontinence Genitourinary:  No dysuria, urinary retention or frequency.  No nocturia. Musculoskeletal:  No neck pain, back pain Integumentary: No rash, pruritus, skin lesions Neurological: as above Psychiatric: No depression at this time.  No anxiety Endocrine: No palpitations, diaphoresis, change in appetite, change in weigh or increased thirst Hematologic/Lymphatic:  No anemia, purpura, petechiae. Allergic/Immunologic: No itchy/runny eyes, nasal congestion, recent allergic reactions, rashes  ALLERGIES: Allergies  Allergen Reactions   Aspirin Hives   Other Hives    Cold temperatures    HOME MEDICATIONS:  Current Outpatient Medications:    acetaminophen (TYLENOL) 325 MG tablet, Take 650-975 mg by mouth every 6 (six) hours as needed for mild pain, moderate pain or headache., Disp: , Rfl:    ibuprofen (ADVIL,MOTRIN) 800 MG tablet, Take 800 mg by mouth every 8 (eight) hours as needed., Disp: , Rfl:    Levocetirizine Dihydrochloride (XYZAL ALLERGY 24HR PO), Take by mouth., Disp: , Rfl:    meclizine (ANTIVERT) 25 MG tablet, Take 25 mg by mouth 3 (three) times daily as needed for dizziness., Disp: , Rfl:    propranolol (INDERAL) 40 MG tablet, Take 40 mg by mouth 3 (three) times daily., Disp: , Rfl:    tacrolimus (PROTOPIC) 0.1 % ointment, Apply topically 2 (two) times daily., Disp: , Rfl:    albuterol (PROVENTIL HFA;VENTOLIN HFA) 108 (90 Base) MCG/ACT inhaler, Inhale 2 puffs into the lungs every 4 (four) hours as needed for wheezing or shortness of breath. (Patient not taking: Reported on 06/11/2023), Disp: 1 Inhaler, Rfl: 0   lisdexamfetamine (VYVANSE) 30 MG capsule, Take 30 mg by mouth daily as needed (for ADHD). (Patient not taking: Reported on 06/11/2023), Disp: , Rfl:   PAST MEDICAL HISTORY: Past Medical History:  Diagnosis Date   Adult ADHD    Bronchitis    lasted 8-10 weeks   Constipation    Gestational diabetes    High cholesterol     Inguinal hernia    Irregular heart beat    MRSA infection    boils   Toxemia in pregnancy    Vitiligo    under breast and underwear areas    PAST SURGICAL HISTORY: Past Surgical History:  Procedure Laterality Date   CESAREAN SECTION     x2   MOUTH SURGERY     Tooth removed   ROBOTIC ASSISTED TOTAL HYSTERECTOMY WITH SALPINGECTOMY Bilateral 09/08/2016   Procedure: ROBOTIC ASSISTED TOTAL HYSTERECTOMY WITH SALPINGECTOMY;  Surgeon: Maxie Better, MD;  Location: [facility];  Service: Gynecology;  Laterality: Bilateral;   WISDOM TOOTH EXTRACTION     x4    FAMILY HISTORY: Family History  Problem Relation Age of Onset   Dementia Mother    Seizures Mother    High Cholesterol Mother  Hypertension Father     SOCIAL HISTORY: Social History   Socioeconomic History   Marital status: Married    Spouse name: Not on file   Number of children: Not on file   Years of education: Not on file   Highest education level: Not on file  Occupational History   Not on file  Tobacco Use   Smoking status: Never   Smokeless tobacco: Never  Substance and Sexual Activity   Alcohol use: Yes    Comment: ocassionally   Drug use: Not Currently    Comment: CBD gummies   Sexual activity: Yes    Birth control/protection: Surgical  Other Topics Concern   Not on file  Social History Narrative   Right handed   Caffeine use: rare   Social Determinants of Health   Financial Resource Strain: Not on file  Food Insecurity: Not on file  Transportation Needs: Not on file  Physical Activity: Not on file  Stress: Not on file  Social Connections: Not on file  Intimate Partner Violence: Not on file       PHYSICAL EXAM  Vitals:   06/11/23 0833  BP: 128/89  Pulse: (!) 105  Weight: 153 lb (69.4 kg)  Height: 5' (1.524 m)     Body mass index is 29.88 kg/m.   OD 20/20 OS 20/30-2   General: The patient is well-developed and well-nourished and in no acute distress  HEENT:  Head is  [place]/AT.  Sclera are anicteric.   Neck:   The neck is nontender.Mild reduced ROm  Skin: Extremities are without rash or  edema.    Neurologic Exam  Mental status: The patient is alert and oriented x 3 at the time of the examination. The patient has apparent normal recent and remote memory, with an apparently normal attention span and concentration ability.   Speech is normal.  Cranial nerves: Extraocular movements are full. Pupils are equal, round, and reactive to light and accomodation.  Color vision is now symmetric,    Facial symmetry is present. There is good facial sensation to soft touch bilaterally.Facial strength is normal.  Trapezius and sternocleidomastoid strength is normal. No dysarthria is noted.  The tongue is midline, and the patient has symmetric elevation of the soft palate. No obvious hearing deficits are noted but Weber lateralized to left.  Motor:  Muscle bulk is normal.   Tone is normal. Strength is  5 / 5 in the arms and left leg except 4+/5 EHL.  Right leg is 3/5 iliopsoas, 4/5 quad, 4+/5 gastrocnemius and hamstring, 4-/5 ankle extension and 3/5 toe extension  Sensory: Sensory testing is intact to pinprick, soft touch and vibration sensation in the arms.  Asymmetry in legs with altered (hyper) touch in right leg and reduced vibration in right leg.    No length dependent changes in sensation.    Other:  She has a mild intention tremor in hands.    Coordination: Cerebellar testing reveals good finger-nose-finger and heel-to-shin bilaterally.  Gait and station: Station requires support to stand up and then can stand without support.  Can take a few short stride steps with right foot drop with minimal support (one hand). . Romberg is negative.   Reflexes: Deep tendon reflexes are symmetric and normal in arms, 1+ at knees (was 3+ last visit) with mild spread and trace in ankles.   .   Plantar responses are flexor.    DIAGNOSTIC DATA (LABS, IMAGING, TESTING) - I reviewed  patient records, labs, notes,  testing and imaging myself where available.  Lab Results  Component Value Date   WBC 8.4 01/15/2023   HGB 12.4 01/15/2023   HCT 35.9 (L) 01/15/2023   MCV 79.2 (L) 01/15/2023   PLT 398 01/15/2023      Component Value Date/Time   NA 137 01/15/2023 2217   K 3.6 01/15/2023 2217   CL 101 01/15/2023 2217   CO2 26 01/15/2023 2217   GLUCOSE 114 (H) 01/15/2023 2217   BUN 9 01/15/2023 2217   CREATININE 0.73 01/15/2023 2217   CALCIUM 9.8 01/15/2023 2217   PROT 6.8 04/15/2022 0414   ALBUMIN 3.9 04/15/2022 0414   AST 15 04/15/2022 0414   ALT 27 04/15/2022 0414   ALKPHOS 47 04/15/2022 0414   BILITOT 0.8 04/15/2022 0414   GFRNONAA >60 01/15/2023 2217   GFRAA >60 09/09/2016 0504       ASSESSMENT AND PLAN  Right leg weakness - Plan: MR CERVICAL SPINE WO CONTRAST, MR THORACIC SPINE WO CONTRAST, MR LUMBAR SPINE WO CONTRAST  Numbness - Plan: MR CERVICAL SPINE WO CONTRAST, MR THORACIC SPINE WO CONTRAST, MR LUMBAR SPINE WO CONTRAST  Gait disturbance - Plan: MR CERVICAL SPINE WO CONTRAST, MR THORACIC SPINE WO CONTRAST, MR LUMBAR SPINE WO CONTRAST  Incontinence of feces, unspecified fecal incontinence type - Plan: MR CERVICAL SPINE WO CONTRAST, MR THORACIC SPINE WO CONTRAST, MR LUMBAR SPINE WO CONTRAST   Change in exam/symptoms with more right leg weakness and gait difficulty and fecal incontinence.   I am most concerned about myelopathy.   We need to check C/T/L spine to rule out spinal cord process and conus/cauda equina syndrome continue therapy Rtc 2 months or as needed if new or worsening symptoms or based on results.   42-minute office visit with the majority of the time spent face-to-face for history and physical, discussion/counseling and decision-making.  Additional time with record review and documentation.  Hebah Bogosian A. Epimenio Foot, MD, [facility] 06/11/2023, 9:07 AM Certified in Neurology, Clinical Neurophysiology, Sleep Medicine and Neuroimaging  [facility] [address] [place], [place] [address] [phone]

## 2023-06-17 ENCOUNTER — Ambulatory Visit (INDEPENDENT_AMBULATORY_CARE_PROVIDER_SITE_OTHER): Payer: BC Managed Care – PPO

## 2023-06-17 DIAGNOSIS — R159 Full incontinence of feces: Secondary | ICD-10-CM | POA: Diagnosis not present

## 2023-06-17 DIAGNOSIS — R29898 Other symptoms and signs involving the musculoskeletal system: Secondary | ICD-10-CM | POA: Diagnosis not present

## 2023-06-17 DIAGNOSIS — R200 Anesthesia of skin: Secondary | ICD-10-CM | POA: Diagnosis not present

## 2023-06-17 DIAGNOSIS — R269 Unspecified abnormalities of gait and mobility: Secondary | ICD-10-CM | POA: Diagnosis not present

## 2023-08-17 ENCOUNTER — Ambulatory Visit: Payer: BC Managed Care – PPO | Admitting: Neurology

## 2023-08-17 ENCOUNTER — Encounter: Payer: Self-pay | Admitting: Neurology

## 2023-08-17 VITALS — BP 137/85 | HR 118 | Ht 60.0 in | Wt 159.5 lb

## 2023-08-17 DIAGNOSIS — R29898 Other symptoms and signs involving the musculoskeletal system: Secondary | ICD-10-CM | POA: Diagnosis not present

## 2023-08-17 DIAGNOSIS — R200 Anesthesia of skin: Secondary | ICD-10-CM | POA: Diagnosis not present

## 2023-08-17 DIAGNOSIS — R159 Full incontinence of feces: Secondary | ICD-10-CM

## 2023-08-17 DIAGNOSIS — L80 Vitiligo: Secondary | ICD-10-CM | POA: Insufficient documentation

## 2023-08-17 DIAGNOSIS — R251 Tremor, unspecified: Secondary | ICD-10-CM

## 2023-08-17 DIAGNOSIS — R269 Unspecified abnormalities of gait and mobility: Secondary | ICD-10-CM

## 2023-08-17 NOTE — Progress Notes (Signed)
[facility]  PATIENT: Natasha Liu DOB: 12-01-1970  REFERRING DOCTOR OR PCP: Linwood Dibbles, MD; Dr. Allena Katz (Ophth); Orvilla Cornwall (PCP) SOURCE: Patient, note from ED, imaging and lab reports, images personally reviewed.  _________________________________   HISTORICAL  CHIEF COMPLAINT:  Chief Complaint  Patient presents with   follow up    Pt in room 10, husband Ron in room.Here for right leg numbness/ tremor follow up. Pt reports balance has improved, still has flash in left eye. Pt said she wears hearing aids now, pt reports incontinence urine and fecal. Only has tremors when anxiety, had 1 fall since last visit. Patient in physical therapy using rolling walker, will soon transfer to cane with PT. Numbness in toes and ankles are better.     HISTORY OF PRESENT ILLNESS:  Natasha Liu is a 52 y.o. woman with vertigo and visual changes.  Update 08/17/2023 After the last visit, due to leg numbness and incontinence, additional studies were done.  MRI of the cervical, thoracic and lumbar spine showed some degenerative changes but no explanation for her symptoms.  She reports the right leg is doing worse but has some left leg with numbness in her left foot.   She continues to report urinary and fecal incontinence.   Urinary incontinence is occurring less than once a week.   Bowel incontinence is once or twice a week.  She notes some foods like kale salad may cause an event.     She is doing PT and is using a cane at times and a walker other times.    Balance is affecting her gait more than strength.       She has a fine tremor in hands.  A couple relatives have BET. She takes propanolol prn.     Earlier due to visual change, she had MRI of the brain and orbits.  These were normal for age.  The MRI of the brain just shows a few scattered T2/FLAIR hyperintense foci in the subcortical or deep white matter consistent with age-appropriate minimal chronic microvascular ischemic  change.  She also had lab work.  Anti-MOG and neuromyelitis optica IgG were both negative.  ESR, CRP, ACE and TSH were in normal range  She reports that her vision is doing better, closer to baseline.  Her dizziness is about the same - she reports being off balanced.   She has seen ENT and they felt she had hearing loss and will benefit from hearing aids.  She feels the hearing loss is recent.  She feels she hears but has trouble making out what people are saying.       Since the last vision her gait has progressively worsened.   She is using a walker now for balance more than strength though the right leg (and to lesser extent the arm) feel weaker.    She has right leg numbness.   She has urinary hesitancy.    She has had bowel incontinence.   PT has noted DTRs were increased at initial evaluation and now decreased (I concur, see below)  She has a tremor, improved with propranolol.   Has FH of tremor and most consistent with BET   History of dizziness and visual changes: She presented to the emergency room 01/15/2023 with intermittent dizziness over the previous few weeks.  Additionally, she had noted intermittent tremors in the arms, right greater than left.  She also reported some visual changes in the left eye with reduced colors compared to  the right.    She had the onset of OS visual changes around that time and also noted colors were washed out. She noted mild pain with eye movements.     Also, she had vertigo and right hand tremor prompting the ED visit.   When I saw her 01/21/2023, she was unchanged.  She notes that the symptoms fluctuate for the most part though the visual symptoms were more persistent.  She has not had other neurologic symptoms in the past.   In the emergency room, she had a CT scan which was normal.  Not available and she opted to continue evaluation as an patient transferred.  She also has vitiligo.  She has had coughing since December 2023  She saw Dr. Allena Katz at [facility] and was found to have a normal retina.   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    He noted some disc blurring inferiorly in both eyes    Imaging: CT scan of the head 01/15/2023 was normal.  The internal auditory canals appear normal.  CXR 01/15/2023 was normal  CTA chest 04/15/2022 showed a small 3 mm pulmonary nodule.     MRI of the cervical spine 06/17/2023 showed a normal spinal cord.  There was mild spinal stenosis at C3-C4 and C5-C6 and some foraminal narrowing at those levels and other levels but no nerve root compression.  MRI of the thoracic spine 06/17/2023 showed mild degenerative changes at T9-T10 and T10-T11 with borderline spinal stenosis at T10-T11 and some foraminal narrowing at that level but no nerve root compression.  MRI of the lumbar spine 06/17/2023 showed degenerative changes at L4-L5 causing moderate left lateral recess stenosis that could affect the left L5 nerve root.  No spinal stenosis.  Milder degenerative changes at other levels without spinal stenosis or nerve root compression.  REVIEW OF SYSTEMS: Constitutional: No fevers, chills, sweats, or change in appetite Eyes: No visual changes, double vision, eye pain Ear, nose and throat: No hearing loss, ear pain, nasal congestion, sore throat Cardiovascular: No chest pain, palpitations Respiratory:  No shortness of breath at rest or with exertion.   No wheezes GastrointestinaI: No nausea, vomiting, diarrhea, abdominal pain, fecal incontinence Genitourinary:  No dysuria, urinary retention or frequency.  No nocturia. Musculoskeletal:  No neck pain, back pain Integumentary: No rash, pruritus, skin lesions Neurological: as above Psychiatric: No depression at this time.  No anxiety Endocrine: No palpitations, diaphoresis, change in appetite, change in weigh or increased thirst Hematologic/Lymphatic:  No anemia, purpura, petechiae. Allergic/Immunologic: No itchy/runny eyes, nasal congestion, recent  allergic reactions, rashes  ALLERGIES: Allergies  Allergen Reactions   Aspirin Hives   Other Hives    Cold temperatures    HOME MEDICATIONS:  Current Outpatient Medications:    acetaminophen (TYLENOL) 325 MG tablet, Take 650-975 mg by mouth every 6 (six) hours as needed for mild pain, moderate pain or headache., Disp: , Rfl:    ibuprofen (ADVIL,MOTRIN) 800 MG tablet, Take 800 mg by mouth every 8 (eight) hours as needed., Disp: , Rfl:    Levocetirizine Dihydrochloride (XYZAL ALLERGY 24HR PO), Take by mouth., Disp: , Rfl:    meclizine (ANTIVERT) 25 MG tablet, Take 25 mg by mouth 3 (three) times daily as needed for dizziness., Disp: , Rfl:    propranolol (INDERAL) 40 MG tablet, Take 40 mg by mouth 3 (three) times daily., Disp: , Rfl:    tacrolimus (PROTOPIC) 0.1 % ointment, Apply topically 2 (two) times daily.,  Disp: , Rfl:    albuterol (PROVENTIL HFA;VENTOLIN HFA) 108 (90 Base) MCG/ACT inhaler, Inhale 2 puffs into the lungs every 4 (four) hours as needed for wheezing or shortness of breath. (Patient not taking: Reported on 06/11/2023), Disp: 1 Inhaler, Rfl: 0   lisdexamfetamine (VYVANSE) 30 MG capsule, Take 30 mg by mouth daily as needed (for ADHD). (Patient not taking: Reported on 06/11/2023), Disp: , Rfl:   PAST MEDICAL HISTORY: Past Medical History:  Diagnosis Date   Adult ADHD    Bronchitis    lasted 8-10 weeks   Constipation    Gestational diabetes    High cholesterol    Inguinal hernia    Irregular heart beat    MRSA infection    boils   Toxemia in pregnancy    Vitiligo    under breast and underwear areas    PAST SURGICAL HISTORY: Past Surgical History:  Procedure Laterality Date   CESAREAN SECTION     x2   MOUTH SURGERY     Tooth removed   ROBOTIC ASSISTED TOTAL HYSTERECTOMY WITH SALPINGECTOMY Bilateral 09/08/2016   Procedure: ROBOTIC ASSISTED TOTAL HYSTERECTOMY WITH SALPINGECTOMY;  Surgeon: Maxie Better, MD;  Location: [facility];  Service: Gynecology;   Laterality: Bilateral;   WISDOM TOOTH EXTRACTION     x4    FAMILY HISTORY: Family History  Problem Relation Age of Onset   Dementia Mother    Seizures Mother    High Cholesterol Mother    Hypertension Father     SOCIAL HISTORY: Social History   Socioeconomic History   Marital status: Married    Spouse name: Not on file   Number of children: Not on file   Years of education: Not on file   Highest education level: Not on file  Occupational History   Not on file  Tobacco Use   Smoking status: Never   Smokeless tobacco: Never  Substance and Sexual Activity   Alcohol use: Yes    Comment: ocassionally   Drug use: Not Currently    Comment: CBD gummies   Sexual activity: Yes    Birth control/protection: Surgical  Other Topics Concern   Not on file  Social History Narrative   Right handed   Caffeine use: rare   Social Determinants of Health   Financial Resource Strain: Not on file  Food Insecurity: Not on file  Transportation Needs: Not on file  Physical Activity: Not on file  Stress: Not on file  Social Connections: Not on file  Intimate Partner Violence: Not on file       PHYSICAL EXAM  Vitals:   08/17/23 1246  BP: 137/85  Pulse: (!) 118  Weight: 159 lb 8 oz (72.3 kg)  Height: 5' (1.524 m)     Body mass index is 31.15 kg/m.   OD 20/20 OS 20/30-2   General: The patient is well-developed and well-nourished and in no acute distress  HEENT:  Head is [place]/AT.  Sclera are anicteric.   Neck:   The neck is nontender.Normal ROM  Skin: Extremities are without rash or  edema.    Neurologic Exam  Mental status: The patient is alert and oriented x 3 at the time of the examination. The patient has apparent normal recent and remote memory, with an apparently normal attention span and concentration ability.   Speech is normal.  Cranial nerves: Extraocular movements are full. Pupils are equal, round, and reactive to light and accomodation.  Color vision is  symmetric,    Facial  symmetry is present. There is good facial sensation to soft touch bilaterally.Facial strength is normal.  Trapezius and sternocleidomastoid strength is normal. No dysarthria is noted.   . No obvious hearing deficits are noted   Motor:  Muscle bulk is normal.   Tone is normal. Strength is  5 / 5 in the arms and left leg except 4+/5 EHL.  Right leg is 3/5 iliopsoas, 4/5 quad, 4+/5 gastrocnemius and hamstring, 4-/5 ankle extension and 3/5 toe extension  Sensory: Sensory testing is intact to pinprick, soft touch and vibration sensation in the arms.  Mild reduced L5 sensation in left foot   Other:  She has a mild intention tremor in hands.    Coordination: Cerebellar testing reveals good finger-nose-finger and heel-to-shin bilaterally.  Gait and station: Station stable and can take steps without walker.. Romberg is negative.   Reflexes: Deep tendon reflexes are symmetric and normal in arms, 2+ at knee and trace in ankles.   .   Plantar responses are flexor.    DIAGNOSTIC DATA (LABS, IMAGING, TESTING) - I reviewed patient records, labs, notes, testing and imaging myself where available.  Lab Results  Component Value Date   WBC 8.4 01/15/2023   HGB 12.4 01/15/2023   HCT 35.9 (L) 01/15/2023   MCV 79.2 (L) 01/15/2023   PLT 398 01/15/2023      Component Value Date/Time   NA 137 01/15/2023 2217   K 3.6 01/15/2023 2217   CL 101 01/15/2023 2217   CO2 26 01/15/2023 2217   GLUCOSE 114 (H) 01/15/2023 2217   BUN 9 01/15/2023 2217   CREATININE 0.73 01/15/2023 2217   CALCIUM 9.8 01/15/2023 2217   PROT 6.8 04/15/2022 0414   ALBUMIN 3.9 04/15/2022 0414   AST 15 04/15/2022 0414   ALT 27 04/15/2022 0414   ALKPHOS 47 04/15/2022 0414   BILITOT 0.8 04/15/2022 0414   GFRNONAA >60 01/15/2023 2217   GFRAA >60 09/09/2016 0504       ASSESSMENT AND PLAN  Right leg weakness  Numbness  Gait disturbance  Tremor  Vitiligo  Incontinence of feces, unspecified fecal  incontinence type   The C/T/L spine imaging showed no explanation for her symptoms.   She is noting a benefit from PT and will continue. Left foot symptoms could be due to an L5 radiculopathy Avoid Gi triggers of incontinence Rtc 6 months or as needed if new or worsening symptoms or based on results.   Pearletha Furl. Epimenio Foot, MD, [facility] 08/17/2023, 4:14 PM Certified in Neurology, Clinical Neurophysiology, Sleep Medicine and Neuroimaging  [facility] [address], [address] [place], [place] [address] [phone]

## 2023-09-22 ENCOUNTER — Telehealth: Payer: Self-pay | Admitting: Neurology

## 2023-09-22 NOTE — Telephone Encounter (Signed)
Debra, can you help with this? Thank you 

## 2023-09-22 NOTE — Telephone Encounter (Signed)
Pt requesting medical records for disability claim. [employer] said they have already sent a request, but have not received a response.  Would like to stop by the office to sign release of medical record form and pay a fee if necessary. Would like a call back.

## 2023-09-22 NOTE — Telephone Encounter (Signed)
Spoke w/ Stanton Kidney, she spoke w/ pt.
